# Patient Record
Sex: Male | Born: 1985 | Hispanic: No | Marital: Married | Smoking: Never smoker
Health system: Southern US, Community
[De-identification: ages and names within clinical notes are randomized; demographics above are authoritative.]

## PROBLEM LIST (undated history)

## (undated) DIAGNOSIS — E559 Vitamin D deficiency, unspecified: Secondary | ICD-10-CM

## (undated) DIAGNOSIS — R0681 Apnea, not elsewhere classified: Secondary | ICD-10-CM

## (undated) DIAGNOSIS — G43909 Migraine, unspecified, not intractable, without status migrainosus: Secondary | ICD-10-CM

## (undated) DIAGNOSIS — R079 Chest pain, unspecified: Secondary | ICD-10-CM

## (undated) DIAGNOSIS — N4889 Other specified disorders of penis: Secondary | ICD-10-CM

## (undated) DIAGNOSIS — E785 Hyperlipidemia, unspecified: Secondary | ICD-10-CM

## (undated) HISTORY — DX: Apnea, not elsewhere classified: R06.81

## (undated) HISTORY — DX: Hyperlipidemia, unspecified: E78.5

## (undated) HISTORY — DX: Vitamin D deficiency, unspecified: E55.9

## (undated) HISTORY — PX: NO PAST SURGERIES: SHX2092

## (undated) HISTORY — DX: Chest pain, unspecified: R07.9

## (undated) HISTORY — PX: CARDIAC CATHETERIZATION: SHX172

---

## 2010-07-26 ENCOUNTER — Other Ambulatory Visit: Payer: Self-pay | Admitting: Internal Medicine

## 2010-07-26 ENCOUNTER — Ambulatory Visit
Admission: RE | Admit: 2010-07-26 | Discharge: 2010-07-26 | Disposition: A | Discharge status: Home / Self Care | Payer: No Typology Code available for payment source | Source: Ambulatory Visit | Attending: Internal Medicine | Admitting: Internal Medicine

## 2010-08-21 ENCOUNTER — Other Ambulatory Visit: Payer: Self-pay | Admitting: Neurosurgery

## 2010-08-22 ENCOUNTER — Ambulatory Visit
Admission: RE | Admit: 2010-08-22 | Discharge: 2010-08-22 | Disposition: A | Discharge status: Home / Self Care | Payer: No Typology Code available for payment source | Source: Ambulatory Visit | Attending: Neurosurgery | Admitting: Neurosurgery

## 2010-08-22 MED ORDER — GADOBENATE DIMEGLUMINE 529 MG/ML IV SOLN
10.0000 mL | Freq: Once | INTRAVENOUS | Status: AC | PRN
  Administered 2010-08-22: 10 mL via INTRAVENOUS

## 2010-10-24 ENCOUNTER — Other Ambulatory Visit: Payer: Self-pay | Admitting: Neurosurgery

## 2010-10-24 DIAGNOSIS — G930 Cerebral cysts: Secondary | ICD-10-CM

## 2010-11-27 ENCOUNTER — Ambulatory Visit
Admission: RE | Admit: 2010-11-27 | Discharge: 2010-11-27 | Disposition: A | Discharge status: Home / Self Care | Payer: No Typology Code available for payment source | Source: Ambulatory Visit | Attending: Neurosurgery | Admitting: Neurosurgery

## 2010-11-27 DIAGNOSIS — G930 Cerebral cysts: Secondary | ICD-10-CM

## 2010-11-27 MED ORDER — IOHEXOL 300 MG/ML  SOLN
75.0000 mL | Freq: Once | INTRAMUSCULAR | Status: AC | PRN
  Administered 2010-11-27: 75 mL via INTRAVENOUS

## 2011-11-04 ENCOUNTER — Other Ambulatory Visit: Payer: Self-pay | Admitting: Neurosurgery

## 2011-11-04 DIAGNOSIS — G930 Cerebral cysts: Secondary | ICD-10-CM

## 2011-11-22 ENCOUNTER — Ambulatory Visit
Admission: RE | Admit: 2011-11-22 | Discharge: 2011-11-22 | Disposition: A | Discharge status: Home / Self Care | Payer: No Typology Code available for payment source | Source: Ambulatory Visit | Attending: Neurosurgery | Admitting: Neurosurgery

## 2011-11-22 DIAGNOSIS — G930 Cerebral cysts: Secondary | ICD-10-CM

## 2013-10-04 ENCOUNTER — Other Ambulatory Visit: Payer: Self-pay | Admitting: Neurosurgery

## 2013-10-04 DIAGNOSIS — G930 Cerebral cysts: Secondary | ICD-10-CM

## 2013-10-12 ENCOUNTER — Ambulatory Visit
Admission: RE | Admit: 2013-10-12 | Discharge: 2013-10-12 | Disposition: A | Discharge status: Home / Self Care | Payer: BC Managed Care – PPO | Source: Ambulatory Visit | Attending: Neurosurgery | Admitting: Neurosurgery

## 2013-10-12 DIAGNOSIS — G930 Cerebral cysts: Secondary | ICD-10-CM

## 2014-09-01 ENCOUNTER — Other Ambulatory Visit: Payer: Self-pay | Admitting: Internal Medicine

## 2014-09-01 DIAGNOSIS — R519 Headache, unspecified: Secondary | ICD-10-CM

## 2014-09-01 DIAGNOSIS — R51 Headache: Principal | ICD-10-CM

## 2014-09-06 ENCOUNTER — Other Ambulatory Visit: Payer: No Typology Code available for payment source

## 2014-09-19 ENCOUNTER — Inpatient Hospital Stay: Admission: RE | Admit: 2014-09-19 | Payer: No Typology Code available for payment source | Source: Ambulatory Visit

## 2014-09-21 ENCOUNTER — Other Ambulatory Visit: Payer: No Typology Code available for payment source

## 2014-11-03 ENCOUNTER — Other Ambulatory Visit: Payer: Self-pay | Admitting: Internal Medicine

## 2014-11-03 DIAGNOSIS — G930 Cerebral cysts: Secondary | ICD-10-CM

## 2014-11-03 DIAGNOSIS — R51 Headache: Principal | ICD-10-CM

## 2014-11-03 DIAGNOSIS — R519 Headache, unspecified: Secondary | ICD-10-CM

## 2014-11-07 ENCOUNTER — Ambulatory Visit
Admission: RE | Admit: 2014-11-07 | Discharge: 2014-11-07 | Disposition: A | Discharge status: Home / Self Care | Payer: 59 | Source: Ambulatory Visit | Attending: Internal Medicine | Admitting: Internal Medicine

## 2014-11-07 DIAGNOSIS — G930 Cerebral cysts: Secondary | ICD-10-CM

## 2014-11-07 DIAGNOSIS — R51 Headache: Principal | ICD-10-CM

## 2014-11-07 DIAGNOSIS — R519 Headache, unspecified: Secondary | ICD-10-CM

## 2014-11-07 MED ORDER — IOPAMIDOL (ISOVUE-300) INJECTION 61%
75.0000 mL | Freq: Once | INTRAVENOUS | Status: AC | PRN
  Administered 2014-11-07: 75 mL via INTRAVENOUS

## 2015-02-28 ENCOUNTER — Emergency Department (HOSPITAL_BASED_OUTPATIENT_CLINIC_OR_DEPARTMENT_OTHER)
Admission: EM | Admit: 2015-02-28 | Discharge: 2015-02-28 | Disposition: A | Discharge status: Home / Self Care | Payer: 59 | Attending: Emergency Medicine | Admitting: Emergency Medicine

## 2015-02-28 ENCOUNTER — Encounter (HOSPITAL_BASED_OUTPATIENT_CLINIC_OR_DEPARTMENT_OTHER): Payer: Self-pay

## 2015-02-28 ENCOUNTER — Emergency Department (HOSPITAL_BASED_OUTPATIENT_CLINIC_OR_DEPARTMENT_OTHER): Payer: 59

## 2015-02-28 DIAGNOSIS — S20219A Contusion of unspecified front wall of thorax, initial encounter: Secondary | ICD-10-CM | POA: Insufficient documentation

## 2015-02-28 DIAGNOSIS — S79911A Unspecified injury of right hip, initial encounter: Secondary | ICD-10-CM | POA: Insufficient documentation

## 2015-02-28 DIAGNOSIS — M545 Low back pain, unspecified: Secondary | ICD-10-CM

## 2015-02-28 DIAGNOSIS — T148XXA Other injury of unspecified body region, initial encounter: Secondary | ICD-10-CM

## 2015-02-28 DIAGNOSIS — Y9389 Activity, other specified: Secondary | ICD-10-CM | POA: Insufficient documentation

## 2015-02-28 DIAGNOSIS — Y92410 Unspecified street and highway as the place of occurrence of the external cause: Secondary | ICD-10-CM | POA: Insufficient documentation

## 2015-02-28 DIAGNOSIS — S4991XA Unspecified injury of right shoulder and upper arm, initial encounter: Secondary | ICD-10-CM | POA: Insufficient documentation

## 2015-02-28 DIAGNOSIS — S3992XA Unspecified injury of lower back, initial encounter: Secondary | ICD-10-CM | POA: Insufficient documentation

## 2015-02-28 DIAGNOSIS — Y998 Other external cause status: Secondary | ICD-10-CM | POA: Insufficient documentation

## 2015-02-28 MED ORDER — HYDROMORPHONE HCL 1 MG/ML IJ SOLN
1.0000 mg | Freq: Once | INTRAMUSCULAR | Status: AC
  Administered 2015-02-28: 1 mg via INTRAMUSCULAR
  Filled 2015-02-28: qty 1

## 2015-02-28 MED ORDER — HYDROCODONE-ACETAMINOPHEN 5-325 MG PO TABS: 1.0000 | ORAL_TABLET | Freq: Four times a day (QID) | ORAL | Status: DC | PRN

## 2015-02-28 MED ORDER — METHOCARBAMOL 500 MG PO TABS
500.0000 mg | ORAL_TABLET | Freq: Once | ORAL | Status: AC
  Administered 2015-02-28: 500 mg via ORAL
  Filled 2015-02-28: qty 1

## 2015-02-28 MED ORDER — METHOCARBAMOL 500 MG PO TABS: 500.0000 mg | ORAL_TABLET | Freq: Two times a day (BID) | ORAL | Status: DC

## 2015-02-28 MED ORDER — HYDROCODONE-ACETAMINOPHEN 5-325 MG PO TABS
1.0000 | ORAL_TABLET | Freq: Once | ORAL | Status: AC
  Administered 2015-02-28: 1 via ORAL
  Filled 2015-02-28: qty 1

## 2015-02-28 MED ORDER — IBUPROFEN 400 MG PO TABS
600.0000 mg | ORAL_TABLET | Freq: Once | ORAL | Status: AC
  Administered 2015-02-28: 600 mg via ORAL
  Filled 2015-02-28 (×2): qty 1

## 2015-02-28 MED ORDER — IBUPROFEN 600 MG PO TABS: 600.0000 mg | ORAL_TABLET | Freq: Four times a day (QID) | ORAL | Status: DC | PRN

## 2015-02-28 NOTE — ED Notes (Signed)
MD at bedside. 
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
## 2015-02-28 NOTE — ED Notes (Signed)
Patient transported to CT 
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
## 2015-07-07 ENCOUNTER — Other Ambulatory Visit: Payer: Self-pay | Admitting: Otolaryngology

## 2015-07-07 ENCOUNTER — Ambulatory Visit
Admission: RE | Admit: 2015-07-07 | Discharge: 2015-07-07 | Disposition: A | Discharge status: Home / Self Care | Payer: BLUE CROSS/BLUE SHIELD | Source: Ambulatory Visit | Attending: Otolaryngology | Admitting: Otolaryngology

## 2015-07-07 DIAGNOSIS — J3489 Other specified disorders of nose and nasal sinuses: Secondary | ICD-10-CM

## 2015-07-07 DIAGNOSIS — R6884 Jaw pain: Secondary | ICD-10-CM

## 2015-07-07 DIAGNOSIS — R519 Headache, unspecified: Secondary | ICD-10-CM

## 2015-07-07 DIAGNOSIS — R51 Headache: Secondary | ICD-10-CM

## 2015-07-07 DIAGNOSIS — M542 Cervicalgia: Secondary | ICD-10-CM

## 2015-07-07 DIAGNOSIS — G8929 Other chronic pain: Secondary | ICD-10-CM

## 2015-12-05 ENCOUNTER — Ambulatory Visit: Payer: BLUE CROSS/BLUE SHIELD | Admitting: Neurology

## 2016-01-31 ENCOUNTER — Ambulatory Visit: Payer: BLUE CROSS/BLUE SHIELD | Admitting: Neurology

## 2016-02-09 ENCOUNTER — Ambulatory Visit: Payer: Self-pay

## 2016-05-23 ENCOUNTER — Other Ambulatory Visit: Payer: Self-pay | Admitting: Urology

## 2016-05-28 ENCOUNTER — Encounter (HOSPITAL_BASED_OUTPATIENT_CLINIC_OR_DEPARTMENT_OTHER): Payer: Self-pay | Admitting: *Deleted

## 2016-05-28 NOTE — Progress Notes (Signed)
NPO AFTER MN.  ARRIVE AT 0715.  NEEDS HG. 

## 2016-06-03 ENCOUNTER — Encounter (HOSPITAL_BASED_OUTPATIENT_CLINIC_OR_DEPARTMENT_OTHER): Payer: Self-pay | Admitting: Anesthesiology

## 2016-06-03 ENCOUNTER — Ambulatory Visit (HOSPITAL_BASED_OUTPATIENT_CLINIC_OR_DEPARTMENT_OTHER)
Admission: RE | Admit: 2016-06-03 | Discharge: 2016-06-03 | Disposition: A | Discharge status: Home / Self Care | Payer: BLUE CROSS/BLUE SHIELD | Source: Ambulatory Visit | Attending: Urology | Admitting: Urology

## 2016-06-03 ENCOUNTER — Ambulatory Visit (HOSPITAL_BASED_OUTPATIENT_CLINIC_OR_DEPARTMENT_OTHER): Payer: BLUE CROSS/BLUE SHIELD | Admitting: Anesthesiology

## 2016-06-03 ENCOUNTER — Encounter (HOSPITAL_BASED_OUTPATIENT_CLINIC_OR_DEPARTMENT_OTHER)
Admission: RE | Disposition: A | Discharge status: Home / Self Care | Payer: Self-pay | Source: Ambulatory Visit | Attending: Urology

## 2016-06-03 DIAGNOSIS — N486 Induration penis plastica: Secondary | ICD-10-CM | POA: Insufficient documentation

## 2016-06-03 DIAGNOSIS — N4889 Other specified disorders of penis: Secondary | ICD-10-CM

## 2016-06-03 HISTORY — DX: Other specified disorders of penis: N48.89

## 2016-06-03 HISTORY — PX: NESBIT PROCEDURE: SHX2087

## 2016-06-03 HISTORY — DX: Migraine, unspecified, not intractable, without status migrainosus: G43.909

## 2016-06-03 LAB — POCT HEMOGLOBIN-HEMACUE: Hemoglobin: 15.6 g/dL (ref 13.0–17.0)

## 2016-06-03 SURGERY — NESBIT PROCEDURE
Anesthesia: General

## 2016-06-03 MED ORDER — MEPERIDINE HCL 25 MG/ML IJ SOLN
6.2500 mg | INTRAMUSCULAR | Status: DC | PRN
  Filled 2016-06-03: qty 1

## 2016-06-03 MED ORDER — PAPAVERINE HCL 30 MG/ML IJ SOLN
INTRAMUSCULAR | Status: DC | PRN
  Administered 2016-06-03: 120 mg via INTRAVENOUS

## 2016-06-03 MED ORDER — LACTATED RINGERS IV SOLN
INTRAVENOUS | Status: DC
  Administered 2016-06-03 (×2): via INTRAVENOUS
  Filled 2016-06-03: qty 1000

## 2016-06-03 MED ORDER — OXYCODONE HCL 5 MG/5ML PO SOLN
5.0000 mg | Freq: Once | ORAL | Status: AC | PRN
  Filled 2016-06-03: qty 5

## 2016-06-03 MED ORDER — PROMETHAZINE HCL 25 MG/ML IJ SOLN
6.2500 mg | INTRAMUSCULAR | Status: DC | PRN
  Filled 2016-06-03: qty 1

## 2016-06-03 MED ORDER — PHENYLEPHRINE HCL 10 MG/ML IJ SOLN
INTRAMUSCULAR | Status: AC
  Filled 2016-06-03: qty 1

## 2016-06-03 MED ORDER — SODIUM CHLORIDE 0.9 % IJ SOLN
INTRAMUSCULAR | Status: AC
  Filled 2016-06-03: qty 50

## 2016-06-03 MED ORDER — CEFAZOLIN SODIUM-DEXTROSE 2-4 GM/100ML-% IV SOLN
2.0000 g | INTRAVENOUS | Status: AC
  Administered 2016-06-03: 2 g via INTRAVENOUS
  Filled 2016-06-03: qty 100

## 2016-06-03 MED ORDER — PROPOFOL 10 MG/ML IV BOLUS
INTRAVENOUS | Status: AC
  Filled 2016-06-03: qty 20

## 2016-06-03 MED ORDER — FENTANYL CITRATE (PF) 100 MCG/2ML IJ SOLN
INTRAMUSCULAR | Status: AC
  Filled 2016-06-03: qty 2

## 2016-06-03 MED ORDER — OXYCODONE HCL 5 MG PO TABS
ORAL_TABLET | ORAL | Status: AC
  Filled 2016-06-03: qty 1

## 2016-06-03 MED ORDER — PROPOFOL 10 MG/ML IV BOLUS
INTRAVENOUS | Status: DC | PRN
  Administered 2016-06-03: 50 mg via INTRAVENOUS
  Administered 2016-06-03: 200 mg via INTRAVENOUS
  Administered 2016-06-03: 50 mg via INTRAVENOUS

## 2016-06-03 MED ORDER — CEFAZOLIN SODIUM-DEXTROSE 2-4 GM/100ML-% IV SOLN
INTRAVENOUS | Status: AC
  Filled 2016-06-03: qty 100

## 2016-06-03 MED ORDER — HYDROMORPHONE HCL 1 MG/ML IJ SOLN
0.2500 mg | INTRAMUSCULAR | Status: DC | PRN
  Filled 2016-06-03: qty 0.5

## 2016-06-03 MED ORDER — LIDOCAINE 2% (20 MG/ML) 5 ML SYRINGE
INTRAMUSCULAR | Status: DC | PRN
  Administered 2016-06-03: 80 mg via INTRAVENOUS

## 2016-06-03 MED ORDER — LIDOCAINE 2% (20 MG/ML) 5 ML SYRINGE
INTRAMUSCULAR | Status: AC
  Filled 2016-06-03: qty 10

## 2016-06-03 MED ORDER — PHENYLEPHRINE HCL 10 MG/ML IJ SOLN
INTRAMUSCULAR | Status: DC | PRN
  Administered 2016-06-03: 0.1 mg

## 2016-06-03 MED ORDER — FENTANYL CITRATE (PF) 100 MCG/2ML IJ SOLN
INTRAMUSCULAR | Status: DC | PRN
Start: 2016-06-03 — End: 2016-06-03
  Administered 2016-06-03 (×3): 50 ug via INTRAVENOUS

## 2016-06-03 MED ORDER — DEXAMETHASONE SODIUM PHOSPHATE 10 MG/ML IJ SOLN
INTRAMUSCULAR | Status: AC
  Filled 2016-06-03: qty 1

## 2016-06-03 MED ORDER — MIDAZOLAM HCL 5 MG/5ML IJ SOLN
INTRAMUSCULAR | Status: DC | PRN
  Administered 2016-06-03: 2 mg via INTRAVENOUS

## 2016-06-03 MED ORDER — OXYCODONE HCL 10 MG PO TABS: 10.0000 mg | ORAL_TABLET | ORAL | 0 refills | Status: DC | PRN

## 2016-06-03 MED ORDER — MIDAZOLAM HCL 2 MG/2ML IJ SOLN
INTRAMUSCULAR | Status: AC
  Filled 2016-06-03: qty 2

## 2016-06-03 MED ORDER — ONDANSETRON HCL 4 MG/2ML IJ SOLN
INTRAMUSCULAR | Status: DC | PRN
  Administered 2016-06-03: 4 mg via INTRAVENOUS

## 2016-06-03 MED ORDER — ONDANSETRON HCL 4 MG/2ML IJ SOLN
INTRAMUSCULAR | Status: AC
  Filled 2016-06-03: qty 2

## 2016-06-03 MED ORDER — OXYCODONE HCL 5 MG PO TABS
5.0000 mg | ORAL_TABLET | Freq: Once | ORAL | Status: AC | PRN
  Administered 2016-06-03: 5 mg via ORAL
  Filled 2016-06-03: qty 1

## 2016-06-03 MED ORDER — DEXAMETHASONE SODIUM PHOSPHATE 4 MG/ML IJ SOLN
INTRAMUSCULAR | Status: DC | PRN
  Administered 2016-06-03: 10 mg via INTRAVENOUS

## 2016-06-03 MED FILL — oxyCODONE HCL 10 MG TABS: 10 | 5 days supply | Qty: 28 | Fill #0

## 2016-06-03 SURGICAL SUPPLY — 53 items
BANDAGE CO FLEX L/F 2IN X 5YD (GAUZE/BANDAGES/DRESSINGS) ×2 IMPLANT
BLADE CLIPPER SURG (BLADE) ×2 IMPLANT
BLADE SURG 15 STRL LF DISP TIS (BLADE) ×1 IMPLANT
BLADE SURG 15 STRL SS (BLADE) ×1
BNDG CONFORM 2 STRL LF (GAUZE/BANDAGES/DRESSINGS) ×2 IMPLANT
BNDG GAUZE ELAST 4 BULKY (GAUZE/BANDAGES/DRESSINGS) ×2 IMPLANT
CATH FOLEY 2WAY SLVR  5CC 16FR (CATHETERS)
CATH FOLEY 2WAY SLVR 5CC 16FR (CATHETERS) IMPLANT
CATH ROBINSON RED A/P 8FR (CATHETERS) ×2 IMPLANT
COVER BACK TABLE 60X90IN (DRAPES) ×2 IMPLANT
COVER MAYO STAND STRL (DRAPES) ×2 IMPLANT
DRAIN PENROSE 18X1/4 LTX STRL (WOUND CARE) IMPLANT
DRAPE LAPAROTOMY 100X72 PEDS (DRAPES) ×2 IMPLANT
ELECT NEEDLE TIP 2.8 STRL (NEEDLE) ×2 IMPLANT
ELECT REM PT RETURN 9FT ADLT (ELECTROSURGICAL) ×2
ELECTRODE REM PT RTRN 9FT ADLT (ELECTROSURGICAL) ×1 IMPLANT
GAUZE SPONGE 4X4 12PLY STRL (GAUZE/BANDAGES/DRESSINGS) ×2 IMPLANT
GLOVE BIO SURGEON STRL SZ8 (GLOVE) ×2 IMPLANT
GOWN STRL REUS W/ TWL LRG LVL3 (GOWN DISPOSABLE) ×2 IMPLANT
GOWN STRL REUS W/ TWL XL LVL3 (GOWN DISPOSABLE) ×1 IMPLANT
GOWN STRL REUS W/TWL LRG LVL3 (GOWN DISPOSABLE) ×2
GOWN STRL REUS W/TWL XL LVL3 (GOWN DISPOSABLE) ×1
KIT ROOM TURNOVER WOR (KITS) ×2 IMPLANT
LOOP VESSEL MAXI BLUE (MISCELLANEOUS) IMPLANT
NEEDLE HYPO 25X1 1.5 SAFETY (NEEDLE) ×2 IMPLANT
NEEDLE HYPO 30GX1 BEV (NEEDLE) ×2 IMPLANT
NEEDLE HYPO 30X.5 LL (NEEDLE) ×4 IMPLANT
NS IRRIG 500ML POUR BTL (IV SOLUTION) ×2 IMPLANT
PACK BASIN DAY SURGERY FS (CUSTOM PROCEDURE TRAY) ×2 IMPLANT
PENCIL BUTTON HOLSTER BLD 10FT (ELECTRODE) ×2 IMPLANT
PLUG CATH AND CAP STER (CATHETERS) ×2 IMPLANT
SET IRRIG Y TYPE TUR BLADDER L (SET/KITS/TRAYS/PACK) IMPLANT
SPONGE LAP 4X18 X RAY DECT (DISPOSABLE) ×2 IMPLANT
SUCTION FRAZIER HANDLE 10FR (MISCELLANEOUS)
SUCTION TUBE FRAZIER 10FR DISP (MISCELLANEOUS) IMPLANT
SUPPORT SCROTAL MED ADLT STRP (MISCELLANEOUS) ×2 IMPLANT
SUT CHROMIC 4 0 RB 1X27 (SUTURE) ×4 IMPLANT
SUT CHROMIC 5 0 RB 1 27 (SUTURE) IMPLANT
SUT ETHIBOND 2 0 SH (SUTURE) ×3
SUT ETHIBOND 2 0 SH 36X2 (SUTURE) ×3 IMPLANT
SUT ETHIBOND 3-0 V-5 (SUTURE) IMPLANT
SUT SILK 4 0 TIES 17X18 (SUTURE) ×2 IMPLANT
SYR 10ML LL (SYRINGE) ×2 IMPLANT
SYR 3ML 23GX1 SAFETY (SYRINGE) ×4 IMPLANT
SYR BULB IRRIGATION 50ML (SYRINGE) ×2 IMPLANT
SYR CONTROL 10ML LL (SYRINGE) ×2 IMPLANT
SYR TB 1ML 27GX1/2 SAFE (SYRINGE) ×1 IMPLANT
SYR TB 1ML 27GX1/2 SAFETY (SYRINGE) ×1
SYRINGE 10CC LL (SYRINGE) ×2 IMPLANT
TOWEL OR 17X24 6PK STRL BLUE (TOWEL DISPOSABLE) ×4 IMPLANT
TUBE CONNECTING 12X1/4 (SUCTIONS) ×2 IMPLANT
WATER STERILE IRR 500ML POUR (IV SOLUTION) ×2 IMPLANT
YANKAUER SUCT BULB TIP NO VENT (SUCTIONS) ×2 IMPLANT

## 2016-06-03 NOTE — Anesthesia Postprocedure Evaluation (Signed)
Anesthesia Post Note  Patient: Dustin Bell  Procedure(s) Performed: Procedure(s) (LRB): NESBIT PROCEDURE 16 DOT PLICATION (N/A)  Patient location during evaluation: PACU Anesthesia Type: General Level of consciousness: awake and alert Pain management: pain level controlled Vital Signs Assessment: post-procedure vital signs reviewed and stable Respiratory status: spontaneous breathing, nonlabored ventilation, respiratory function stable and patient connected to nasal cannula oxygen Cardiovascular status: blood pressure returned to baseline and stable Postop Assessment: no signs of nausea or vomiting Anesthetic complications: no       Last Vitals:  Vitals:   06/03/16 1100 06/03/16 1315  BP: 112/62 127/77  Pulse: 90 73  Resp: 15 16  Temp:  36.3 C    Last Pain:  Vitals:   06/03/16 1300  TempSrc:   PainSc: 3                  Shelton SilvasKevin D Kloe Oates

## 2016-06-03 NOTE — Transfer of Care (Signed)
Immediate Anesthesia Transfer of Care Note  Patient: Dustin Bell  Procedure(s) Performed: Procedure(s): NESBIT PROCEDURE 16 DOT PLICATION (N/A)  Patient Location: PACU  Anesthesia Type:General  Level of Consciousness: awake, alert , oriented and patient cooperative  Airway & Oxygen Therapy: Patient Spontanous Breathing and Patient connected to nasal cannula oxygen  Post-op Assessment: Report given to RN and Post -op Vital signs reviewed and stable  Post vital signs: Reviewed and stable  Last Vitals:  Vitals:   06/03/16 0737  BP: 129/75  Pulse: 82  Resp: 16  Temp: 36.5 C    Last Pain:  Vitals:   06/03/16 0737  TempSrc: Oral      Patients Stated Pain Goal: 7 (06/03/16 0824)  Complications: No apparent anesthesia complications

## 2016-06-03 NOTE — Anesthesia Preprocedure Evaluation (Signed)
Anesthesia Evaluation  Patient identified by MRN, date of birth, ID band Patient awake    Reviewed: Allergy & Precautions, NPO status , Patient's Chart, lab work & pertinent test results  Airway Mallampati: II  TM Distance: >3 FB Neck ROM: Full    Dental no notable dental hx.    Pulmonary neg pulmonary ROS,    Pulmonary exam normal breath sounds clear to auscultation       Cardiovascular negative cardio ROS Normal cardiovascular exam Rhythm:Regular Rate:Normal     Neuro/Psych negative neurological ROS  negative psych ROS   GI/Hepatic negative GI ROS, Neg liver ROS,   Endo/Other  negative endocrine ROS  Renal/GU negative Renal ROS     Musculoskeletal negative musculoskeletal ROS (+)   Abdominal   Peds  Hematology negative hematology ROS (+)   Anesthesia Other Findings   Reproductive/Obstetrics                             Anesthesia Physical Anesthesia Plan  ASA: I  Anesthesia Plan: General   Post-op Pain Management:    Induction: Intravenous  Airway Management Planned: LMA  Additional Equipment:   Intra-op Plan:   Post-operative Plan: Extubation in OR  Informed Consent: I have reviewed the patients History and Physical, chart, labs and discussed the procedure including the risks, benefits and alternatives for the proposed anesthesia with the patient or authorized representative who has indicated his/her understanding and acceptance.   Dental advisory given  Plan Discussed with: CRNA  Anesthesia Plan Comments:         Anesthesia Quick Evaluation  

## 2016-06-03 NOTE — H&P (Signed)
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 He has had penile curvature with his erections for 3 years. His penis curves upward. His penile curvature is based in the penile base. His curvature has not changed in the past 6 months. His curvature has not changed in the past 12 months.   He has not noticed a knot on his penis. He does not have penile narrowing with his erections. He does not have penile shortening with his erections. His symptoms of Peyronie's Disease do have an impact on intercourse. He has not been previously treated.   He does complain of curved erections, dorsally. This is been that way for 2-3 years. He would like to consider therapy to straighten this.   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 He said he slowly developed dorsal curvature from the base of the penis. He is able to achieve a good, firm erection. He has no pain with his erections. It has stabilized.     ALLERGIES: No Allergies    MEDICATIONS: Flomax 0.4 mg capsule, ext release 24 hr 1 capsule PO Daily  FLUoxetine HCl - 20 MG Oral Tablet Oral     GU PSH: Cystoscopy - 05/06/2016    NON-GU PSH: None   GU PMH: Organic oligospermia (Stable), He has seen Dr. 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 It was recommended that he have IVF - 05/06/2016, Oligospermia, - 07/11/2015 Peyronies Disease (Stable), This is bothersome and he would like an attempted repair - 05/06/2016, Peyronie's disease, - 07/11/2015 Splitting of Stream (Stable), He does complain of a slow stream. Cystoscopically there is no stricture. - 05/06/2016, (Stable, Chronic), Needs to RTC for cysto. , - 04/24/2016 Male Infertility, Nance Pear, Male infertility - 07/11/2015      PMH Notes:  2015-04-05 13:43:54 - Note: No significant past  medical history   NON-GU PMH: Encounter for general adult medical examination without abnormal findings, Encounter for preventive health examination - 04/05/2015    FAMILY HISTORY: No pertinent family history - Runs In Family   SOCIAL HISTORY: Marital Status: Married Current Smoking Status: Patient has never smoked.  Has never drank.  Does not drink caffeine. 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    REVIEW OF SYSTEMS:    GU Review Male:   Patient reports burning/ pain with urination and penile pain. Patient denies frequent urination, hard to postpone urination, get up at night to urinate, leakage of urine, stream starts and stops, trouble starting your stream, have to strain to urinate , and erection problems.  Gastrointestinal (Upper):   Patient denies vomiting, nausea, and indigestion/ heartburn.  Gastrointestinal (Lower):   Patient denies diarrhea and constipation.  Constitutional:   Patient denies fever, night sweats, weight loss, and fatigue.  Skin:   Patient denies skin rash/ lesion and itching.  Eyes:   Patient denies blurred vision and double vision.  Ears/ Nose/ Throat:   Patient denies sore throat and sinus problems.  Hematologic/Lymphatic:   Patient denies swollen glands and easy bruising.  Cardiovascular:   Patient denies leg swelling and chest pains.  Respiratory:   Patient denies cough and shortness of breath.  Endocrine:   Patient denies excessive thirst.  Musculoskeletal:   Patient denies back pain and joint pain.  Neurological:  Patient denies headaches and dizziness.  Psychologic:   Patient denies depression and anxiety.   VITAL SIGNS:    Weight 148 lb / 67.13 kg  Height 68 in / 172.72 cm  BP 118/77 mmHg  Pulse 80 /min  BMI 22.5 kg/m   GU PHYSICAL EXAMINATION:    Penis: Circumcised, no foreskin warts, no cracks. I was able to palpate some slight fullness dorsally at the base of his penis consistent with a mild Peyronie's plaque.. No  balanitis, no meatal stenosis.    MULTI-SYSTEM PHYSICAL EXAMINATION:    Constitutional: Well-nourished. No physical deformities. Normally developed. Good grooming.  Neck: Neck symmetrical, not swollen. Normal tracheal position.  Respiratory: No labored breathing, no use of accessory muscles.   Cardiovascular: Normal temperature, normal extremity pulses, no swelling, no varicosities.  Lymphatic: No enlargement of neck, axillae, groin.  Skin: No paleness, no jaundice, no cyanosis. No lesion, no ulcer, no rash.  Neurologic / Psychiatric: Oriented to time, oriented to place, oriented to person. No depression, no anxiety, no agitation.  Gastrointestinal: No mass, no tenderness, no rigidity, non obese abdomen.  Eyes: Normal conjunctivae. Normal eyelids.  Ears, Nose, Mouth, and Throat: Left ear no scars, no lesions, no masses. Right ear no scars, no lesions, no masses. Nose no scars, no lesions, no masses. Normal hearing. Normal lips.  Musculoskeletal: Normal gait and station of head and neck.      PAST DATA REVIEWED:  Source Of History:  Patient  Records Review:   Previous Patient Records, POC Tool   PROCEDURES:          Urinalysis Dipstick Dipstick Cont'd  Color: Yellow Bilirubin: Neg  Appearance: Clear Ketones: Neg  Specific Gravity: 1.015 Blood: Neg  pH: 5.5 Protein: Neg  Glucose: Neg Urobilinogen: 0.2    Nitrites: Neg    Leukocyte Esterase: Neg    ASSESSMENT:      ICD-10 Details  1 GU:   Peyronies Disease  We discussed the treatment options available for his penile curvature. Because he is able to achieve a satisfactory erection I did not discuss penile prosthesis implantation to any great degree with him however I did discuss plication as a surgical means of correcting his curvature with the understanding that this could potentially result in some foreshortening of the penis. I made him aware of the fact that the procedure would be shortening the penis on the contralateral side to  the curvature and the curvature was caused by foreshortening of the penis on the side where he was experiencing the curvature. We did discuss plaque incision and patch grafting as an alternative which may result in less penile shortening but does carry greater risk of de novo erectile dysfunction and glans numbness.   He was interested in [place]. We discussed the fact that the medication is a collagenase and how this affects the Peyronie's plaque. I discussed with the patient the treatment course which consists of a maximum of 4 treatments cycles. Each treatment cycle consists of 2 Xiaflex injection procedures and one in office penile modeling procedure. The second Xiaflex injection procedure occurs 1-3 days after the first. The in office modeling procedure is performed 1-3 days after the second injection. After the third visit of each treatment cycle, the patient performs approximately 6 weeks of daily, at home penile modeling activities. We discussed the fact that up to 4 treatment cycles can be performed with the goal of achieving less than 15 of curvature.   We discussed the risks including the risk  of penile fracture, bruising and/or swelling and mild to moderate penile pain. The patient was counseled to contact the office immediately at any time if he should experience a popping sound or sensation in an erect penis, the sudden loss of the ability to maintain an erection, severe purple bruising and swelling of the penis, difficulty urinating or blood in the urine or severe pain in the penis. He was also counseled to wait 2 weeks after the second injection of each treatment cycle before resuming sexual activity, provided pain and swelling have subsided.   I then discussed with the patient that prior to each treatment cycle an artificial erection will be induced by injecting intracavernosal PGE-1 and that the plaque will be located by palpation and point of maximum concavity. This area will be marked and  the penis allowed to detumesce. An injection of a second medication (Neo-Synephrine) may be necessary in order to result in detumescence.   I went over the probability of success being defined as a decrease in his degree of curvature of to approximately 15% or less however a decrease in curvature to the degree that he is able to satisfactorily engage in intercourse and that he and his partner are pleased with his ultimately the goal.   After a thorough discussion and having answered all of his questions to his satisfaction.  He has elected to proceed with surgical correction.  I have gone over the incision used, the risks and complications as well as the outpatient nature of the procedure and the anticipated postoperative course.  He understands and has elected to proceed.     PLAN: 16-Dot plication.

## 2016-06-03 NOTE — Anesthesia Procedure Notes (Signed)
Procedure Name: LMA Insertion Date/Time: 06/03/2016 8:48 AM Performed by: Tyrone NineSAUVE, Alaiza Yau F Pre-anesthesia Checklist: Patient identified, Timeout performed, Emergency Drugs available, Suction available and Patient being monitored Patient Re-evaluated:Patient Re-evaluated prior to inductionOxygen Delivery Method: Circle system utilized Preoxygenation: Pre-oxygenation with 100% oxygen Intubation Type: IV induction Ventilation: Mask ventilation without difficulty LMA: LMA inserted LMA Size: 4.0 Number of attempts: 1 Airway Equipment and Method: Bite block Placement Confirmation: positive ETCO2 and breath sounds checked- equal and bilateral Tube secured with: Tape Dental Injury: Teeth and Oropharynx as per pre-operative assessment

## 2016-06-03 NOTE — Discharge Instructions (Signed)
Postoperative instructions for penile surgery ° °Wound: ° °In most cases your incision will have absorbable sutures that run along the course of your incision and will dissolve within the first 10-20 days. Some will fall out even earlier. Expect some redness as the sutures dissolved but this should occur only around the sutures. If there is generalized redness, especially with increasing pain or swelling, let us know. The penis will very likely get "black and blue" as the blood in the tissues spread. Sometimes the whole penis will turn colors. The black and blue is followed by a yellow and brown color. In time, all the discoloration will go away. ° °Diet: ° °You may return to your normal diet within 24 hours following your surgery. You may note some mild nausea and possibly vomiting the first 6-8 hours following surgery. This is usually due to the side effects of anesthesia, and will disappear quite soon. I would suggest clear liquids and a very light meal the first evening following your surgery. ° °Activity: ° °Your physical activity should be restricted the first 48 hours. During that time you should remain relatively inactive, moving about only when necessary. During the first 7-10 days following surgery he should avoid lifting any heavy objects (anything greater than 15 pounds), and avoid strenuous exercise. If you work, ask us specifically about your restrictions, both for work and home. We will write a note to your employer if needed. ° °Ice packs can be placed on and off over the penis for the first 48 hours to help relieve the pain and keep the swelling down. Frozen peas or corn in a ZipLock bag can be frozen, used and re-frozen. Fifteen minutes on and 15 minutes off is a reasonable schedule.  ° °Hygiene: ° °You may shower 48 hours after your surgery. Tub bathing should be restricted until the seventh day. ° °Medication: ° °You will be sent home with some type of pain medication. In many cases you will be  sent home with a narcotic pain pill (Vicodin or Tylox). If the pain is not too bad, you may take either Tylenol (acetaminophen) or Advil (ibuprofen) which contain no narcotic agents, and might be tolerated a little better, with fewer side effects. If the pain medication you are sent home with does not control the pain, you will have to let us know. Some narcotic pain medications cannot be given or refilled by a phone call to a pharmacy. ° °Problems you should report to us: ° °· Fever of 101.0 degrees Fahrenheit or greater. °· Moderate or severe swelling under the skin incision or involving the scrotum. °· Drug reaction such as hives, a rash, nausea or vomiting.  ° ° ° °Post Anesthesia Home Care Instructions ° °Activity: °Get plenty of rest for the remainder of the day. A responsible adult should stay with you for 24 hours following the procedure.  °For the next 24 hours, DO NOT: °-Drive a car °-Operate machinery °-Drink alcoholic beverages °-Take any medication unless instructed by your physician °-Make any legal decisions or sign important papers. ° °Meals: °Start with liquid foods such as gelatin or soup. Progress to regular foods as tolerated. Avoid greasy, spicy, heavy foods. If nausea and/or vomiting occur, drink only clear liquids until the nausea and/or vomiting subsides. Call your physician if vomiting continues. ° °Special Instructions/Symptoms: °Your throat may feel dry or sore from the anesthesia or the breathing tube placed in your throat during surgery. If this causes discomfort, gargle with warm salt water.   discomfort should disappear within 24 hours.  If you had a scopolamine patch placed behind your ear for the management of post- operative nausea and/or vomiting:  1. The medication in the patch is effective for 72 hours, after which it should be removed.  Wrap patch in a tissue and discard in the trash. Wash hands thoroughly with soap and water. 2. You may remove the patch earlier than 72  hours if you experience unpleasant side effects which may include dry mouth, dizziness or visual disturbances. 3. Avoid touching the patch. Wash your hands with soap and water after contact with the patch.

## 2016-06-03 NOTE — Op Note (Signed)
PATIENT:  Dustin Bell  PRE-OPERATIVE DIAGNOSIS: Penile curvature  POST-OPERATIVE DIAGNOSIS: Same  PROCEDURE: 8-Dot plication  SURGEON:  Garnett FarmMark C Prashant Glosser  INDICATION: Dustin Bell is a 31 year old male who sustained an injury to his penis at the base. This resulted in the penis curving cephalad during erection. Because of this urination with an erection in the morning was exceedingly difficult and this also affected his ability to have intercourse. We discussed the treatment options and he has elected to proceed with surgical correction. We have discussed the possibility of some foreshortening of the penis as well as the possibility of him being able to feel the knots in the suture used for the procedure. He understands these risks as well as the other potential risks and complications and has elected to proceed.  ANESTHESIA:  General  EBL:  Minimal  DRAINS: None  LOCAL MEDICATIONS USED:  None  SPECIMEN:  None  Description of procedure: After informed consent the patient was taken to the operating room and placed on the table in a supine position. General anesthesia was then administered. Once fully anesthetized I cleaned the side of the penis with an alcohol swab and injected 60 mg of papaverine into the corpus cavernosum using a 30-gauge needle. The genitalia and lower abdomen were then sterilely prepped and draped in standard fashion. An official timeout was then performed.  I noted that with a full erection there was curvature and the flexion of the penis at the base in a cephalad direction with very mild shaft curvature which I do not believe is of clinical significance nor a result of the injury that he had described. I therefore made an incision in the midline on the ventral surface at the penoscrotal junction. This was carried down to the corpus spongiosum in the midline and then I dissected laterally to expose the corpus cavernosum on the right and left sides. I dissected back to  a location deep along the corpus cavernosum.  2-0 Ethibond suture was then used to place the plicating stitches by first going into and then out of the corpus cavernosum at its most proximal extent and then traveling equidistant to the location of greatest curvature and placing a second in and out stitch at this location. This was performed on both right and left sides approximately 3 mm from the corpus spongiosum. I then placed a surgeon's knot and tied each down and placed a shod hemostat at the level of the knot. This allowed me to make adjustments and completely straighten the penis so that while it was a direct it protruded straight out from the body in a normal, anatomic position. I then tied each of the sutures.  I then injected initially 500 g of Neo-Synephrine into the corpus cavernosum using a 30-gauge needle. I repeated this a total of 4 times with minimal detumescence. I therefore placed a 21-gauge butterfly needle into the corpus cavernosum and withdrew 20 mL of blood and then injected a final 1000 g of Neo-Synephrine. This resulted in complete detumescence which persisted. There was no elevation in blood pressure at any time during the procedure.  I then closed the deep tissue over the corpus cavernosum and corpus spongiosum in the midline with a running 4-0 chromic suture. I then closed the skin with a running 4-0 chromic and applied Neosporin ointment to the incision, covered this with gauze and then gently wrapped the penis with Komform. I then used fluffed Curlex and placed this at the base of the  penis and applied a scrotal support. The patient was awakened and taken to the recovery room in stable and satisfactory condition. He tolerated procedure well with no intraoperative complications.  PLAN OF CARE: Discharge to home after PACU  PATIENT DISPOSITION:  PACU - hemodynamically stable.

## 2016-06-04 ENCOUNTER — Encounter (HOSPITAL_BASED_OUTPATIENT_CLINIC_OR_DEPARTMENT_OTHER): Payer: Self-pay | Admitting: Urology

## 2017-02-02 ENCOUNTER — Emergency Department (HOSPITAL_BASED_OUTPATIENT_CLINIC_OR_DEPARTMENT_OTHER): Payer: BLUE CROSS/BLUE SHIELD

## 2017-02-02 ENCOUNTER — Encounter (HOSPITAL_BASED_OUTPATIENT_CLINIC_OR_DEPARTMENT_OTHER): Payer: Self-pay | Admitting: Emergency Medicine

## 2017-02-02 ENCOUNTER — Emergency Department (HOSPITAL_BASED_OUTPATIENT_CLINIC_OR_DEPARTMENT_OTHER)
Admission: EM | Admit: 2017-02-02 | Discharge: 2017-02-02 | Disposition: A | Discharge status: Home / Self Care | Payer: BLUE CROSS/BLUE SHIELD | Attending: Emergency Medicine | Admitting: Emergency Medicine

## 2017-02-02 DIAGNOSIS — J069 Acute upper respiratory infection, unspecified: Secondary | ICD-10-CM | POA: Diagnosis not present

## 2017-02-02 DIAGNOSIS — Z79899 Other long term (current) drug therapy: Secondary | ICD-10-CM | POA: Insufficient documentation

## 2017-02-02 DIAGNOSIS — Z87891 Personal history of nicotine dependence: Secondary | ICD-10-CM | POA: Diagnosis not present

## 2017-02-02 DIAGNOSIS — R05 Cough: Secondary | ICD-10-CM | POA: Diagnosis not present

## 2017-02-02 DIAGNOSIS — R079 Chest pain, unspecified: Secondary | ICD-10-CM | POA: Diagnosis present

## 2017-02-02 DIAGNOSIS — R0789 Other chest pain: Secondary | ICD-10-CM | POA: Diagnosis not present

## 2017-02-02 LAB — CBC WITH DIFFERENTIAL/PLATELET
Basophils Absolute: 0.1 10*3/uL (ref 0.0–0.1)
Basophils Relative: 1 %
EOS ABS: 0.2 10*3/uL (ref 0.0–0.7)
EOS PCT: 3 %
HCT: 42.3 % (ref 39.0–52.0)
Hemoglobin: 14.9 g/dL (ref 13.0–17.0)
LYMPHS PCT: 38 %
Lymphs Abs: 3 10*3/uL (ref 0.7–4.0)
MCH: 28 pg (ref 26.0–34.0)
MCHC: 35.2 g/dL (ref 30.0–36.0)
MCV: 79.5 fL (ref 78.0–100.0)
Monocytes Absolute: 0.5 10*3/uL (ref 0.1–1.0)
Monocytes Relative: 7 %
Neutro Abs: 4.1 10*3/uL (ref 1.7–7.7)
Neutrophils Relative %: 51 %
PLATELETS: 289 10*3/uL (ref 150–400)
RBC: 5.32 MIL/uL (ref 4.22–5.81)
RDW: 12.5 % (ref 11.5–15.5)
WBC: 7.8 10*3/uL (ref 4.0–10.5)

## 2017-02-02 LAB — COMPREHENSIVE METABOLIC PANEL
ALT: 53 U/L (ref 17–63)
ANION GAP: 6 (ref 5–15)
AST: 33 U/L (ref 15–41)
Albumin: 4.6 g/dL (ref 3.5–5.0)
Alkaline Phosphatase: 64 U/L (ref 38–126)
BUN: 12 mg/dL (ref 6–20)
CHLORIDE: 103 mmol/L (ref 101–111)
CO2: 29 mmol/L (ref 22–32)
CREATININE: 0.95 mg/dL (ref 0.61–1.24)
Calcium: 9.2 mg/dL (ref 8.9–10.3)
Glucose, Bld: 87 mg/dL (ref 65–99)
POTASSIUM: 3.6 mmol/L (ref 3.5–5.1)
Sodium: 138 mmol/L (ref 135–145)
Total Bilirubin: 0.4 mg/dL (ref 0.3–1.2)
Total Protein: 7.9 g/dL (ref 6.5–8.1)

## 2017-02-02 LAB — TROPONIN I: Troponin I: <0.03 ng/mL (ref <0.03)

## 2017-02-02 LAB — RAPID STREP SCREEN (MED CTR MEBANE ONLY): STREPTOCOCCUS, GROUP A SCREEN (DIRECT): NEGATIVE

## 2017-02-02 MED ORDER — IBUPROFEN 800 MG PO TABS
800.0000 mg | ORAL_TABLET | Freq: Once | ORAL | Status: AC
  Administered 2017-02-02: 800 mg via ORAL
  Filled 2017-02-02: qty 1

## 2017-02-02 MED ORDER — NAPROXEN 500 MG PO TABS: 500.0000 mg | ORAL_TABLET | Freq: Two times a day (BID) | ORAL | 0 refills | Status: DC

## 2017-02-02 NOTE — ED Provider Notes (Signed)
[facility] Note   CSN: [id number] Arrival date & time: 02/02/17  0027     History   Chief Complaint Chief Complaint  Patient presents with  . Chest Pain    HPI Dustin Bell is a 31 y.o. male.  HPI  This a 31 year old male who presents with cough, chest pain, sore throat. Onset of symptoms earlier today. Patient reports he woke this morning with a sore throat. He reports chills without fevers. He has had a nonproductive cough. Proximal and one hour prior to arrival he developed left-sided sharp chest pain that is nonradiating. Current pain is 8 out of 10. He has not taken anything for the pain. No recent long travel, hospitalization, history of blood clots. He does report a history of hyperlipidemia. No early family history of heart disease.  Past Medical History:  Diagnosis Date  . Migraine   . Penile curvature, acquired    INJURY    There are no active problems to display for this patient.   Past Surgical History:  Procedure Laterality Date  . NESBIT PROCEDURE N/A 06/03/2016   Procedure: NESBIT PROCEDURE 16 DOT PLICATION;  Surgeon: Ihor Gully, MD;  Location: [facility];  Service: Urology;  Laterality: N/A;  . NO PAST SURGERIES         Home Medications    Prior to Admission medications   Medication Sig Start Date End Date Taking? Authorizing Provider  FLUoxetine (PROZAC) 40 MG capsule Take 40 mg by mouth every evening.    [provider]  ibuprofen (ADVIL,MOTRIN) 200 MG tablet Take 200 mg by mouth every 6 (six) hours as needed.    [provider]  naproxen (NAPROSYN) 500 MG tablet Take 1 tablet (500 mg total) by mouth 2 (two) times daily. 02/02/17   Dustin Bell, Dustin Masker, MD  Oxycodone HCl 10 MG TABS Take 1 tablet (10 mg total) by mouth every 4 (four) hours as needed. 06/03/16   Ihor Gully, MD    Family History No family history on file.  Social History Social History  Substance Use Topics  . Smoking  status: Never Smoker  . Smokeless tobacco: Former Neurosurgeon    Types: Snuff    Quit date: 12/27/2015  . Alcohol use No     Allergies   Patient has no known allergies.   Review of Systems Review of Systems  Constitutional: Positive for chills. Negative for fever.  HENT: Positive for sore throat. Negative for trouble swallowing.   Respiratory: Positive for cough. Negative for shortness of breath.   Cardiovascular: Positive for chest pain. Negative for leg swelling.  Gastrointestinal: Negative for abdominal pain, nausea and vomiting.  All other systems reviewed and are negative.    Physical Exam Updated Vital Signs BP 123/85 (BP Location: Right Arm)   Pulse 76   Temp 97.9 F (36.6 C) (Oral)   Resp 17   Ht  (1.727 m)   Wt 70.3 kg (155 lb)   SpO2 99%   BMI 23.57 kg/m   Physical Exam  Constitutional: He is oriented to person, place, and time. He appears well-developed and well-nourished. No distress.  HENT:  Head: Normocephalic and atraumatic.  Uvula midline, posterior oropharynx with mild erythema, scattered palatal petechiae noted, no tonsillar exudate, no tonsillar enlargement, no trismus  Eyes: Pupils are equal, round, and reactive to light.  Neck: Neck supple.  Cardiovascular: Normal rate, regular rhythm and normal heart sounds.   No murmur heard. Pulmonary/Chest: Effort normal and breath  sounds normal. No respiratory distress. He has no wheezes. He exhibits tenderness.  Tenderness palpation left chest wall without crepitus  Abdominal: Soft. There is no tenderness.  Musculoskeletal: He exhibits no edema.  Lymphadenopathy:    He has no cervical adenopathy.  Neurological: He is alert and oriented to person, place, and time.  Skin: Skin is warm and dry.  Psychiatric: He has a normal mood and affect.  Nursing note and vitals reviewed.    ED Treatments / Results  Labs (all labs ordered are listed, but only abnormal results are displayed) Labs Reviewed  RAPID  STREP SCREEN (NOT AT [facility])  CULTURE, GROUP A STREP [facility])  CBC WITH DIFFERENTIAL/PLATELET  TROPONIN I  COMPREHENSIVE METABOLIC PANEL    EKG  EKG Interpretation  Date/Time:  Sunday February 02 2017 00:36:28 EDT Ventricular Rate:  78 PR Interval:    QRS Duration: 93 QT Interval:  352 QTC Calculation: 401 R Axis:   90 Text Interpretation:  Sinus rhythm Borderline right axis deviation No prior for comparison Confirmed by Ross Marcus ([id number]) on 02/02/2017 1:19:27 AM       Radiology Dg Chest 2 View  Result Date: 02/02/2017 CLINICAL DATA:  Cough and chest pain. EXAM: CHEST  2 VIEW COMPARISON:  Chest CT 02/28/2015 FINDINGS: The cardiomediastinal contours are normal. The lungs are clear. Pulmonary vasculature is normal. No consolidation, pleural effusion, or pneumothorax. EKG lead projects over the left upper chest. No acute osseous abnormalities are seen. IMPRESSION: No active cardiopulmonary disease. Electronically Signed   By: Rubye Oaks M.D.   On: 02/02/2017 01:04    Procedures Procedures (including critical care time)  Medications Ordered in ED Medications  ibuprofen (ADVIL,MOTRIN) tablet 800 mg (800 mg Oral Given 02/02/17 0134)     Initial Impression / Assessment and Plan / ED Course  I have reviewed the triage vital signs and the nursing notes.  Pertinent labs & imaging results that were available during my care of the patient were reviewed by me and considered in my medical decision making (see chart for details).     Patient presents with upper respiratory symptoms and chest pain. Nontoxic on exam. Afebrile. Vital signs reassuring. He does have palatal petechiae. Reproducible chest pain on exam. Doubt ACS or PE. EKG, troponin, chest x-ray clear. No pneumonia. Patient given Toradol for pain. Strep screen is negative. No indication at this time for antibodies. Supportive measures recommended.  After history, exam, and medical workup I feel the patient has been  appropriately medically screened and is safe for discharge home. Pertinent diagnoses were discussed with the patient. Patient was given return precautions.   Final Clinical Impressions(s) / ED Diagnoses   Final diagnoses:  URI with cough and congestion  Chest wall pain    New Prescriptions New Prescriptions   NAPROXEN (NAPROSYN) 500 MG TABLET    Take 1 tablet (500 mg total) by mouth 2 (two) times daily.     Shon Baton, MD 02/02/17 [phone]

## 2017-02-02 NOTE — ED Triage Notes (Signed)
Pt presents with c/o chest pain, cough, sore throat, and sneezing that all started today.

## 2017-02-04 LAB — CULTURE, GROUP A STREP (THRC)

## 2017-07-05 ENCOUNTER — Encounter (HOSPITAL_BASED_OUTPATIENT_CLINIC_OR_DEPARTMENT_OTHER): Payer: Self-pay | Admitting: *Deleted

## 2017-07-05 ENCOUNTER — Emergency Department (HOSPITAL_BASED_OUTPATIENT_CLINIC_OR_DEPARTMENT_OTHER)
Admission: EM | Admit: 2017-07-05 | Discharge: 2017-07-05 | Disposition: A | Discharge status: Home / Self Care | Payer: BLUE CROSS/BLUE SHIELD | Attending: Emergency Medicine | Admitting: Emergency Medicine

## 2017-07-05 ENCOUNTER — Emergency Department (HOSPITAL_BASED_OUTPATIENT_CLINIC_OR_DEPARTMENT_OTHER): Payer: BLUE CROSS/BLUE SHIELD

## 2017-07-05 ENCOUNTER — Other Ambulatory Visit: Payer: Self-pay

## 2017-07-05 DIAGNOSIS — R091 Pleurisy: Secondary | ICD-10-CM | POA: Diagnosis not present

## 2017-07-05 DIAGNOSIS — Z79899 Other long term (current) drug therapy: Secondary | ICD-10-CM | POA: Insufficient documentation

## 2017-07-05 DIAGNOSIS — R0789 Other chest pain: Secondary | ICD-10-CM

## 2017-07-05 DIAGNOSIS — R079 Chest pain, unspecified: Secondary | ICD-10-CM | POA: Diagnosis present

## 2017-07-05 MED ORDER — IBUPROFEN 800 MG PO TABS: 800.0000 mg | ORAL_TABLET | Freq: Three times a day (TID) | ORAL | 0 refills | Status: AC

## 2017-07-05 NOTE — ED Triage Notes (Signed)
[facility] cp onset 5-6 minutes ago pain increased w movement denies inj  Was seen 2 months ago for same

## 2017-07-05 NOTE — ED Provider Notes (Signed)
[facility] EMERGENCY DEPARTMENT Provider Note   CSN: [id number] Arrival date & time: 07/05/17  0102     History   Chief Complaint Chief Complaint  Patient presents with  . Chest Pain    HPI Dustin Bell is a 32 y.o. male.  Chief complaint is chest pain  HPI presents with a few hour history of sharp well localized left-sided anterior chest pain.  Similar to an episode he had several months ago.  Had a normal ER evaluation at that time.  Sharp.  Somewhat pleuritic.  Does not her to use or move his arm.  No cough.  No recent illness.  No fever.  No exertional pain or symptoms.  Past Medical History:  Diagnosis Date  . Migraine   . Penile curvature, acquired    INJURY    There are no active problems to display for this patient.   Past Surgical History:  Procedure Laterality Date  . NESBIT PROCEDURE N/A 06/03/2016   Procedure: NESBIT PROCEDURE 16 DOT PLICATION;  Surgeon: Ihor GullyMark Ottelin, MD;  [facility];  Service: Urology;  Laterality: N/A;  . NO PAST SURGERIES         Home Medications    Prior to Admission medications   Medication Sig Start Date End Date Taking? Authorizing Provider  atorvastatin (LIPITOR) 40 MG tablet Take 40 mg by mouth daily.   Yes [provider]  FLUoxetine (PROZAC) 40 MG capsule Take 40 mg by mouth every evening.    [provider]  ibuprofen (ADVIL,MOTRIN) 800 MG tablet Take 1 tablet (800 mg total) by mouth 3 (three) times daily. 07/05/17   Rolland PorterJames, Hilja Kintzel, MD  naproxen (NAPROSYN) 500 MG tablet Take 1 tablet (500 mg total) by mouth 2 (two) times daily. 02/02/17   Horton, Mayer Maskerourtney F, MD  Oxycodone HCl 10 MG TABS Take 1 tablet (10 mg total) by mouth every 4 (four) hours as needed. 06/03/16   Ihor Gullyttelin, Andren Bethea, MD    Family History No family history on file.  Social History Social History   Tobacco Use  . Smoking status: Never Smoker  . Smokeless tobacco: Former NeurosurgeonUser    Types: Snuff  Substance  Use Topics  . Alcohol use: No  . Drug use: No     Allergies   Patient has no known allergies.   Review of Systems Review of Systems  Constitutional: Negative for appetite change, chills, diaphoresis, fatigue and fever.  HENT: Negative for mouth sores, sore throat and trouble swallowing.   Eyes: Negative for visual disturbance.  Respiratory: Negative for cough, chest tightness, shortness of breath and wheezing.   Cardiovascular: Positive for chest pain.  Gastrointestinal: Negative for abdominal distention, abdominal pain, diarrhea, nausea and vomiting.  Endocrine: Negative for polydipsia, polyphagia and polyuria.  Genitourinary: Negative for dysuria, frequency and hematuria.  Musculoskeletal: Negative for gait problem.  Skin: Negative for color change, pallor and rash.  Neurological: Negative for dizziness, syncope, light-headedness and headaches.  Hematological: Does not bruise/bleed easily.  Psychiatric/Behavioral: Negative for behavioral problems and confusion.     Physical Exam Updated Vital Signs BP (!) 152/96 (BP Location: Left Arm)   Pulse 90   Temp 98.2 F (36.8 C) (Oral)   Resp 18   Ht 5\' 7"  (1.702 m)   Wt 70.3 kg (155 lb)   SpO2 100%   BMI 24.28 kg/m   Physical Exam  Constitutional: He appears well-developed and well-nourished.  HENT:  Head: Normocephalic and atraumatic.  Eyes: Conjunctivae are  normal.  Neck: Neck supple.  Cardiovascular: Normal rate and regular rhythm.  No murmur heard. Tenderness in the left anterior chest.  Clear bilateral breath sounds.  Pulmonary/Chest: Effort normal and breath sounds normal. No respiratory distress.  Abdominal: Soft. There is no tenderness.  Musculoskeletal: He exhibits no edema.  Neurological: He is alert.  Skin: Skin is warm and dry.  Psychiatric: He has a normal mood and affect.  Nursing note and vitals reviewed.    ED Treatments / Results  Labs (all labs ordered are listed, but only abnormal results are  displayed) Labs Reviewed - No data to display  EKG  EKG Interpretation  Date/Time:  Saturday July 05 2017 01:12:51 EST Ventricular Rate:  91 PR Interval:  140 QRS Duration: 82 QT Interval:  334 QTC Calculation: 410 R Axis:   70 Text Interpretation:  Normal sinus rhythm Normal ECG Confirmed by Rolland Porter ([id number]) on 07/05/2017 4:06:38 AM       Radiology No results found.  Procedures Procedures (including critical care time)  Medications Ordered in ED Medications - No data to display   Initial Impression / Assessment and Plan / ED Course  I have reviewed the triage vital signs and the nursing notes.  Pertinent labs & imaging results that were available during my care of the patient were reviewed by me and considered in my medical decision making (see chart for details).    Benign exam.  Low risk history.  Normal EKG and chest x-ray.  No pneumothorax, infiltrate, effusion.  No sign of pericarditis.  Sinus rhythm.  Plan Advil/ibuprofen.  Recheck as needed.  Final Clinical Impressions(s) / ED Diagnoses   Final diagnoses:  Chest wall pain  Pleurisy    ED Discharge Orders        Ordered    ibuprofen (ADVIL,MOTRIN) 800 MG tablet  3 times daily     07/05/17 0455       Rolland Porter, MD 07/05/17 0500

## 2018-04-25 IMAGING — DX DG CHEST 2V
2 series · 2 of 2 positions shown · non-contrast
Comparison: 02/02/2017

CLINICAL DATA: Chest pain, onset today.

EXAM:
CHEST  2 VIEW

[chest pa]
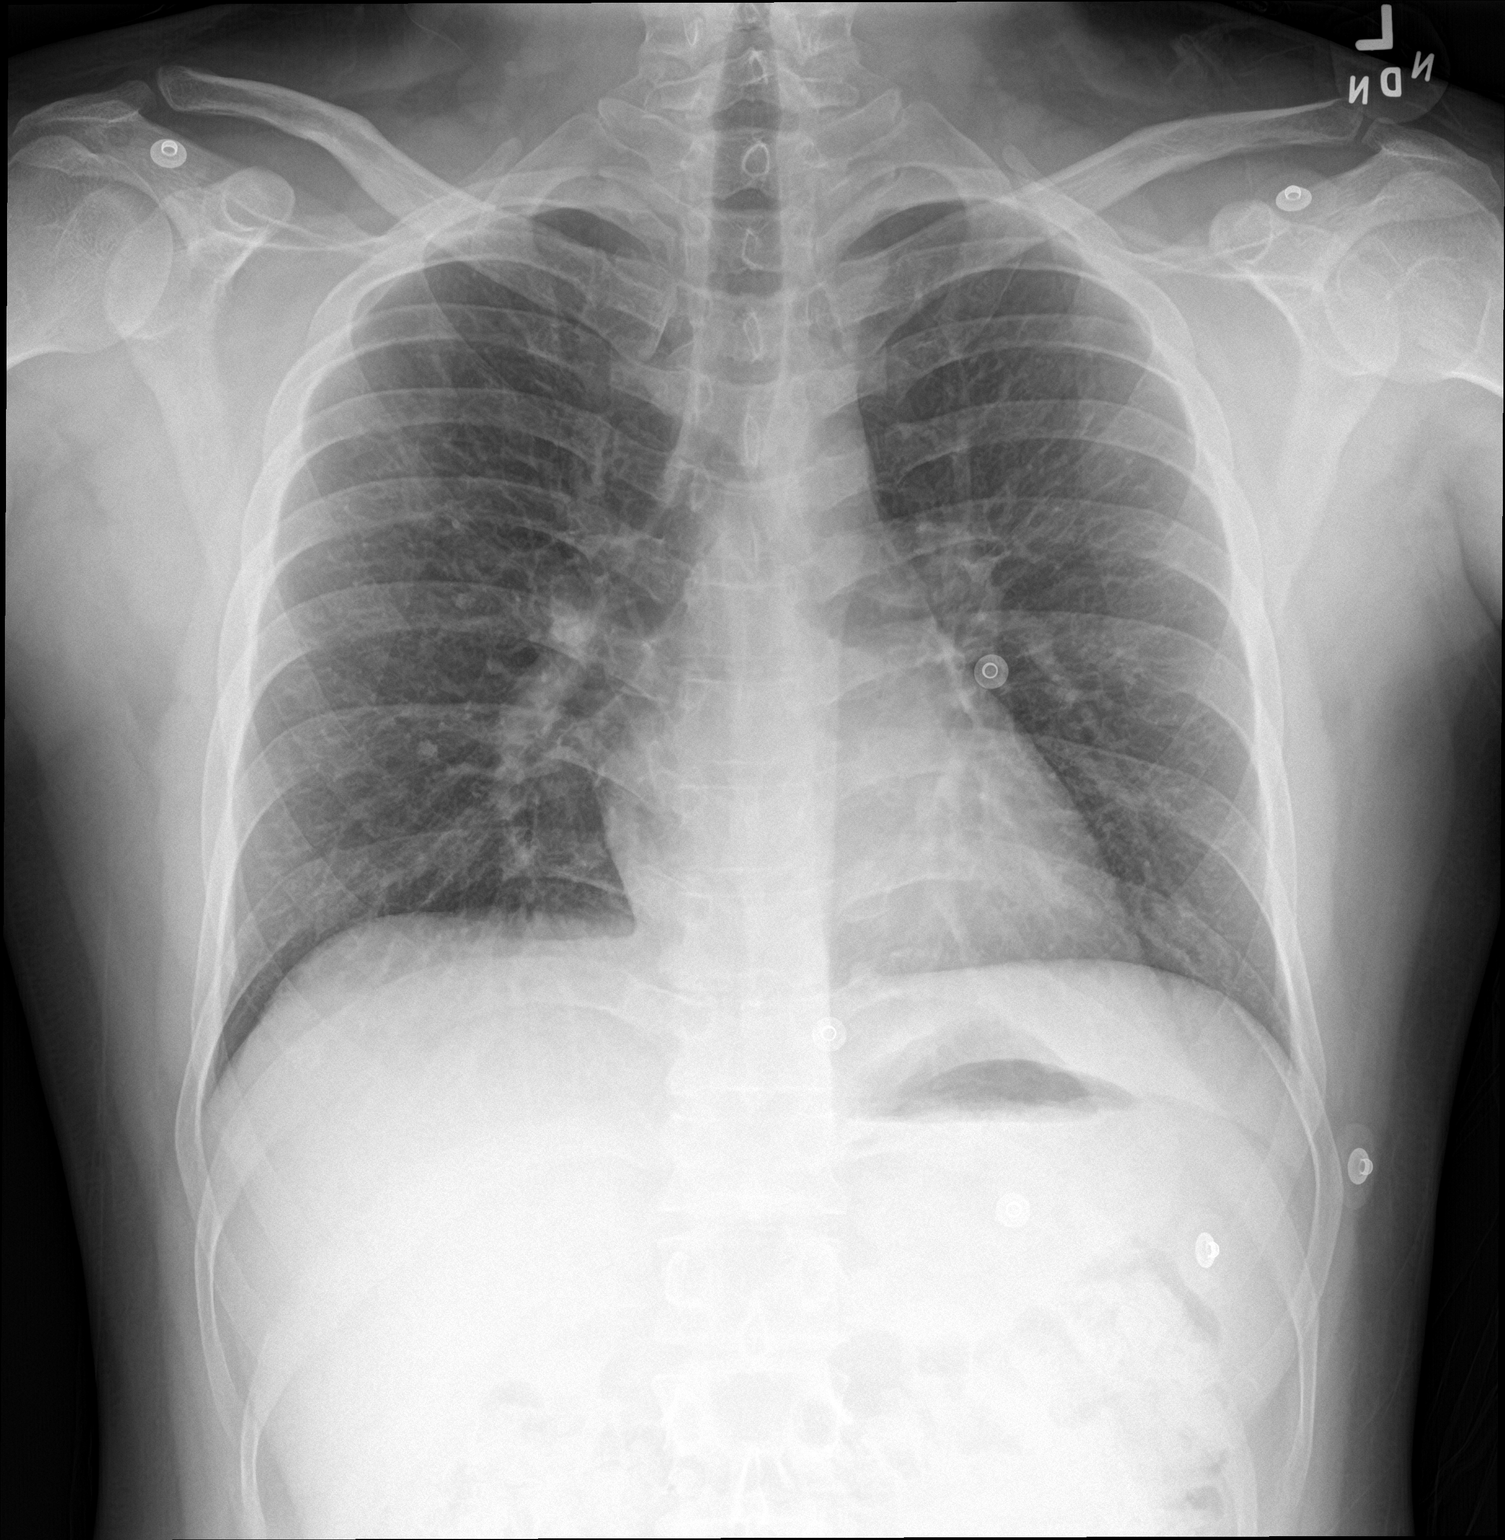

[chest lat]
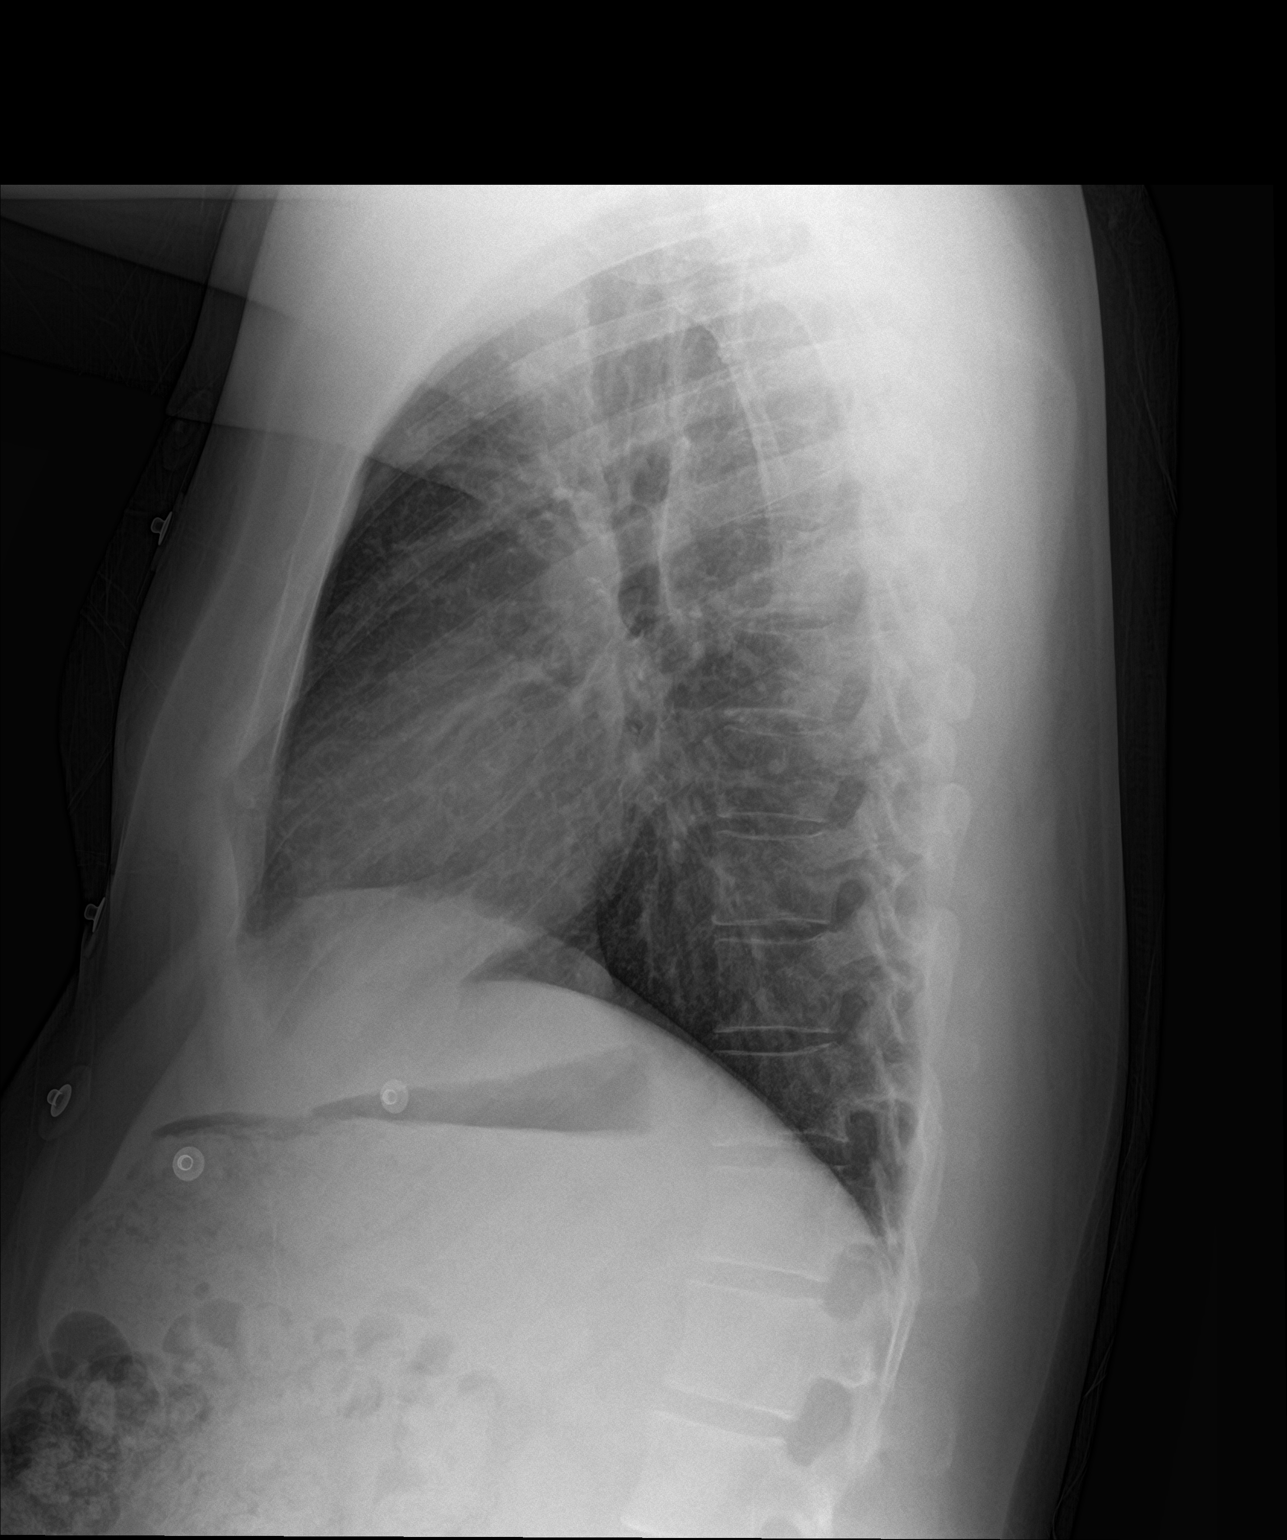

[2 of 2 positions shown; findings below may reference images not displayed]

FINDINGS: The cardiomediastinal contours are normal. The lungs are clear.
Pulmonary vasculature is normal. No consolidation, pleural effusion,
or pneumothorax. No acute osseous abnormalities are seen.
IMPRESSION: No active cardiopulmonary disease.

## 2018-10-15 ENCOUNTER — Other Ambulatory Visit: Payer: Self-pay

## 2018-10-15 ENCOUNTER — Emergency Department (HOSPITAL_BASED_OUTPATIENT_CLINIC_OR_DEPARTMENT_OTHER)
Admission: EM | Admit: 2018-10-15 | Discharge: 2018-10-15 | Disposition: A | Discharge status: Home / Self Care | Payer: BLUE CROSS/BLUE SHIELD | Attending: Emergency Medicine | Admitting: Emergency Medicine

## 2018-10-15 ENCOUNTER — Encounter (HOSPITAL_BASED_OUTPATIENT_CLINIC_OR_DEPARTMENT_OTHER): Payer: Self-pay | Admitting: *Deleted

## 2018-10-15 DIAGNOSIS — Y92410 Unspecified street and highway as the place of occurrence of the external cause: Secondary | ICD-10-CM | POA: Diagnosis not present

## 2018-10-15 DIAGNOSIS — Y9389 Activity, other specified: Secondary | ICD-10-CM | POA: Insufficient documentation

## 2018-10-15 DIAGNOSIS — Z79899 Other long term (current) drug therapy: Secondary | ICD-10-CM | POA: Insufficient documentation

## 2018-10-15 DIAGNOSIS — Y999 Unspecified external cause status: Secondary | ICD-10-CM | POA: Insufficient documentation

## 2018-10-15 DIAGNOSIS — M545 Low back pain: Secondary | ICD-10-CM | POA: Insufficient documentation

## 2018-10-15 DIAGNOSIS — M542 Cervicalgia: Secondary | ICD-10-CM | POA: Diagnosis present

## 2018-10-15 MED ORDER — NAPROXEN 375 MG PO TABS: 375.0000 mg | ORAL_TABLET | Freq: Two times a day (BID) | ORAL | 0 refills | Status: AC

## 2018-10-15 MED ORDER — METHOCARBAMOL 500 MG PO TABS: 500.0000 mg | ORAL_TABLET | Freq: Two times a day (BID) | ORAL | 0 refills | Status: AC
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
## 2023-07-30 ENCOUNTER — Ambulatory Visit: Payer: No Typology Code available for payment source | Admitting: Cardiology

## 2023-10-07 ENCOUNTER — Ambulatory Visit: Payer: Self-pay | Attending: Cardiology | Admitting: Cardiology

## 2023-10-07 NOTE — Progress Notes (Deleted)
  Cardiology Office Note:  .   Date:  10/07/2023  ID:  Dustin Bell, DOB Mar 29, 1986, MRN [id number] PCP: Jearldine Mina, MD  [facility] Providers Cardiologist:  None { Click to update primary MD,subspecialty MD or APP then REFRESH:1}  History of Present Illness: Dustin Bell   Dustin Bell is a 38 y.o. Patient with mixed hypercholesterolemia, premature coronary disease in his father with markedly elevated Lp(a) at 55, presents to establish care, he has had cardiac workup including cardiac catheterization in January 2025 which revealed normal coronary arteries.  Discussed the use of AI scribe software for clinical note transcription with the patient, who gave verbal consent to proceed.  History of Present Illness    Labs   External Labs:  Care everywhere labs 09/30/2023:  Sodium 138, potassium 3.9, BUN 15, creatinine 0.87, EGFR >90 mL.  Total cholesterol 210, triglycerides 476, HDL 34, LDL 117.  Non-HDL cholesterol 176.  Labs 05/30/2023:  Vitamin D 24.6.  ROS  ***ROS  Physical Exam:   VS:  There were no vitals taken for this visit.   Wt Readings from Last 3 Encounters:  10/15/18 158 lb (71.7 kg)  07/05/17 155 lb (70.3 kg)  02/02/17 155 lb (70.3 kg)    ***Physical Exam Studies Reviewed: Dustin Bell    [facility] Cardiac catheterization 06/13/2023: Angiographically normal coronary arteries, right dominant circulation.  EKG:         ***  Medications and allergies    No Known Allergies   Current Outpatient Medications:    atorvastatin (LIPITOR) 40 MG tablet, Take 40 mg by mouth daily., Disp: , Rfl:    FLUoxetine (PROZAC) 40 MG capsule, Take 40 mg by mouth every evening., Disp: , Rfl:    ibuprofen  (ADVIL ,MOTRIN ) 800 MG tablet, Take 1 tablet (800 mg total) by mouth 3 (three) times daily., Disp: 21 tablet, Rfl: 0   methocarbamol  (ROBAXIN ) 500 MG tablet, Take 1 tablet (500 mg total) by mouth 2 (two) times daily., Disp: 20 tablet, Rfl: 0   naproxen  (NAPROSYN ) 375 MG  tablet, Take 1 tablet (375 mg total) by mouth 2 (two) times daily., Disp: 20 tablet, Rfl: 0   OMEPRAZOLE PO, Take by mouth., Disp: , Rfl:    Oxycodone  HCl 10 MG TABS, Take 1 tablet (10 mg total) by mouth every 4 (four) hours as needed., Disp: 28 tablet, Rfl: 0   No orders of the defined types were placed in this encounter.    There are no discontinued medications.   ASSESSMENT AND PLAN: .      ICD-10-CM   1. Mixed hyperlipidemia  E78.2       Assessment and Plan Assessment & Plan      Signed,  Knox Perl, MD, [facility] 10/07/2023, 1:33 PM [facility] [address] [place], [place] [address] Phone: [phone]. Fax:  [phone]

## 2023-10-08 ENCOUNTER — Encounter: Payer: Self-pay | Admitting: Cardiology

## 2024-03-23 NOTE — Progress Notes (Signed)
 Payor: [employer] MEDICAID [employer] / Plan: [place] MEDICAID [employer] COMMUNITY PLAN / Product Type: Managed Medicaid /   History of Present Illness   History of Present Illness The patient presents for evaluation of hematuria and right lower back pain.  Presents to clinic today complaining of a 1 week history of right lower back pain that radiates towards the side with associated hematuria.  Denies fever, chills, nausea/vomiting, abdominal pain, dysuria, urinary frequency, urgency, penile discharge, known trauma/injury to explain current symptoms.  Denies history of kidney stones.  Was initially seen in the urgent care on 10/30.  Was found to have a large amount of blood in his urine.  Was suspected to have muscle strain versus kidney stone.  Was discharged home with Flomax, 7.5 meloxicam, and Flexeril which provided minimal improvement despite symptomatic relief, but he noticed that the blood in his urine decreased while taking the Flomax, but returned after discontinuing it.  States that he will experience a sharp stabbing pain for 5 to 15 minutes and then spontaneously resolve.  Denies any known aggravating/alleviating symptoms.  Denies experiencing similar symptoms in the past.   Review of Systems  Constitutional:  Negative for chills, diaphoresis, fatigue and fever.  Gastrointestinal:  Negative for abdominal distention, abdominal pain, nausea and vomiting.  Genitourinary:  Positive for hematuria. Negative for decreased urine volume, dysuria, frequency, penile discharge, penile pain, penile swelling, scrotal swelling, testicular pain and urgency.  Musculoskeletal:        Right lower back pain  All other systems reviewed and are negative.   Past Contributory History   Current Medications[1] Allergies[2] Medical History[3] Surgical History[4] Family History[5]    Physical Exam   Vitals:   03/23/24 1544  BP: 106/68  Pulse: 88  Resp: 18  Temp: 97.6 F (36.4 C)  SpO2: 97%  Weight: 76.7 kg  (169 lb)  Height: 1.702 m (5' 7)   Physical Exam Vitals and nursing note reviewed.  Constitutional:      General: He is not in acute distress.    Appearance: Normal appearance. He is normal weight. He is not ill-appearing, toxic-appearing or diaphoretic.  HENT:     Head: Normocephalic and atraumatic.     Right Ear: External ear normal.     Left Ear: External ear normal.     Nose: Nose normal.     Mouth/Throat:     Lips: Pink.     Mouth: Mucous membranes are moist.  Eyes:     Conjunctiva/sclera: Conjunctivae normal.     Pupils: Pupils are equal, round, and reactive to light.  Cardiovascular:     Rate and Rhythm: Normal rate and regular rhythm.  Pulmonary:     Effort: Pulmonary effort is normal. No respiratory distress.     Breath sounds: Normal breath sounds. No stridor. No wheezing, rhonchi or rales.  Chest:     Chest wall: No tenderness.  Musculoskeletal:        General: Normal range of motion.     Cervical back: Normal range of motion and neck supple.       Back:  Skin:    General: Skin is warm.  Neurological:     General: No focal deficit present.     Mental Status: He is alert and oriented to person, place, and time.  Psychiatric:        Mood and Affect: Mood normal.        Behavior: Behavior is cooperative.     Procedures   Procedures  Results  Lab results: Recent Results (from the past 24 hours)  POC Urinalysis Auto without Microscopic   Collection Time: 03/23/24  3:57 PM  Result Value Ref Range   Color, Urine Yellow Yellow   Clarity, Urine Clear Clear   Glucose, Urine Negative Negative mg/dL   Bilirubin, Urine Negative Negative   Ketones, Urine Negative Negative mg/dL   Specific Gravity, Urine 1.025 1.010, 1.015, 1.020, 1.025   Blood, Urine Moderate (A) Negative   pH, Urine 5.5 5.0, 5.5, 6.0, 6.5, 7.0, 7.5, 8.0   Protein, Urine Negative Negative mg/dL   Urobilinogen, Urine 0.2 <2.0 mg/dL   Nitrite, Urine Negative Negative   Leukocyte Esterase,  Urine Negative Negative   Kit/Device Lot # [id number]    Kit/Device Expiration Date 9/26   Basic Metabolic Panel   Collection Time: 03/23/24  4:37 PM  Result Value Ref Range   Sodium 138 136 - 145 mmol/L   Potassium 3.8 3.4 - 4.5 mmol/L   Chloride 100 98 - 107 mmol/L   CO2 30 21 - 31 mmol/L   Anion Gap 8 6 - 14 mmol/L   Glucose, Random 83 70 - 99 mg/dL   Blood Urea Nitrogen (BUN) 17 7 - 25 mg/dL   Creatinine 9.10 9.29 - 1.30 mg/dL   eGFR >09 >40 fO/fpw/8.26f7   Calcium 9.6 8.6 - 10.3 mg/dL   BUN/Creatinine Ratio    CBC with Differential   Collection Time: 03/23/24  4:37 PM  Result Value Ref Range   WBC 8.30 4.40 - 11.00 10*3/uL   RBC 5.47 4.50 - 5.90 10*6/uL   Hemoglobin 15.4 14.0 - 17.5 g/dL   Hematocrit 55.1 58.4 - 50.4 %   Mean Corpuscular Volume (MCV) 82.0 80.0 - 96.0 fL   Mean Corpuscular Hemoglobin (MCH) 28.2 27.5 - 33.2 pg   Mean Corpuscular Hemoglobin Conc (MCHC) 34.5 33.0 - 37.0 g/dL   Red Cell Distribution Width (RDW) 12.9 12.3 - 17.0 %   Platelet Count (PLT) 306 150 - 450 10*3/uL   Mean Platelet Volume (MPV) 8.1 6.8 - 10.2 fL   Neutrophils % 47 %   Lymphocytes % 42 %   Monocytes % 7 %   Eosinophils % 4 %   Basophils % 1 %   nRBC % 0 %   Neutrophils Absolute 3.90 1.80 - 7.80 10*3/uL   Lymphocytes # 3.40 1.00 - 4.80 10*3/uL   Monocytes # 0.60 0.00 - 0.80 10*3/uL   Eosinophils # 0.30 0.00 - 0.50 10*3/uL   Basophils # 0.10 0.00 - 0.20 10*3/uL   nRBC Absolute 0.00 <=0.00 10*3/uL  Chlamydia / Gonococcus (GC), NAAT   Collection Time: 03/23/24  4:59 PM  Result Value Ref Range   Chlamydia (CT) Negative Negative   Gonorrhea (GC) Negative Negative    X-ray results: Radiology Results (last 7 days)     ** No results found for the last 168 hours. **       Diagnosis & Disposition   1. Hematuria, unspecified type  POC Urinalysis Auto without Microscopic   Urine Culture   Urine Culture   Chlamydia / Gonococcus (GC), NAAT   CBC with Differential   Basic Metabolic  Panel   CT Stone Search WO Contrast    2. Acute right flank pain  CT Stone Search WO Contrast      Assessment & Plan 1.  Hematuria/right flank pain: - Blood in the urine has been present for about a week, initially improving with medication but returning after cessation. - Pain is  not reproducible in clinic with direct palpation, trunk rotation, or CVA tenderness.  He has moderate amount of blood in urine today while in clinic.  He has no known history of nephrolithiasis.  Will obtain CBC, BMP, urine culture, and testing for gonorrhea/chlamydia.  Unfortunately, were not able to obtain CT imaging today, but will pursue CT abdomen pelvis without contrast for further workup and evaluation tomorrow.  Discussed at length worrisome signs and symptoms and when to call/return to clinic/report emergency department.  For now, encouraged continued supportive care with Flomax and 600-800 mg of ibuprofen  as needed for pain.  Plan pending lab results/CT.    Counseled regarding condition(s) and all patient questions answered. If a new prescription was given today, then I discussed potential side effects, drug interactions, and instructions for taking the medication. Reviewed worrisome signs and symptoms to watch for and instructed patient to seek medical attention for any worsening, prolonged, changing, or new symptoms. I have discussed the signs and symptoms that would warrant proceeding to an emergency department.  All questions have been answered and the patient has voiced understanding and agreement with the plan of care.   Return for Follow-up with CT tomorrow.  There are no Patient Instructions on file for this visit.   This documented history was created with the aid of Dragon dictation software as well as [employer], an automated transcribing artificial intelligence program, with light post-production editing.       [1] Current Outpatient Medications  Medication Sig Dispense Refill  .  atorvastatin (LIPITOR) 40 mg tablet Take 1 tablet (40 mg total) by mouth daily. 30 tablet 0  . cetirizine (ZyrTEC) 10 mg tablet Take 1 tablet (10 mg total) by mouth daily. 30 tablet 0  . cyclobenzaprine (FLEXERIL) 10 mg tablet Take 1 tablet (10 mg total) by mouth 2 (two) times a day as needed for muscle spasms. 14 tablet 0  . ergocalciferol (VITAMIN D2) 1,250 mcg (50,000 unit) capsule Take 1 capsule (50,000 Units total) by mouth every 7 days. 4 capsule 0  . FLUoxetine (PROzac) 20 mg capsule Take 1 capsule (20 mg total) by mouth daily Indications: psychiatric disorder. 90 capsule 1  . fluticasone propionate (FLONASE) 50 mcg/spray nasal spray Administer 1 spray into each nostril daily. 16 g 0  . meloxicam (MOBIC) 7.5 mg tablet Take 1 tablet (7.5 mg total) by mouth daily. 90 tablet 3  . omega 3-dha-epa-fish oil (OMEGA 3) 1,000 mg DR capsule Take 1 capsule by mouth daily.    SABRA omeprazole (PriLOSEC) 40 mg DR capsule Take 1 capsule (40 mg total) by mouth in the morning. 90 capsule 0  . tamsulosin (FLOMAX) 0.4 mg cap Take 1 capsule (0.4 mg total) by mouth daily for 14 days. 14 capsule 0  . traZODone (DESYREL) 50 mg tablet Take 1 tablet (50 mg total) by mouth nightly as needed for sleep Indications: insomnia associated with depression. 30 tablet 1   No current facility-administered medications for this visit.  [2] No Known Allergies [3] Past Medical History: Diagnosis Date  . Anxiety   . Dyslipidemia   . Headache   . TMJ syndrome   [4] Past Surgical History: Procedure Laterality Date  . CARDIAC CATHETERIZATION Left 06/13/2023   CV CATHETERIZATION LEFT HEART performed by Carlin Hoe, MD at [facility] INVASIVE LAB  . CARDIAC CATHETERIZATION N/A 06/13/2023   Coronary angiography performed by Carlin Hoe, MD at [facility] INVASIVE LAB  . NO PAST SURGERIES     Procedure: NO PAST SURGERIES  [  5] Family History Problem Relation Name Age of Onset  . Hyperlipidemia Mother Cathern Flight   . Headaches Mother  Cathern Flight   . Depression Mother Cathern Flight   . Heart disease Father    . Stroke Neg Hx    . Seizures Neg Hx    . Parkinsonism Neg Hx    . Multiple sclerosis Neg Hx    . Dementia Neg Hx

## 2024-03-29 NOTE — Progress Notes (Signed)
 Labs overall stable. The only recommendation is continuing the statin.

## 2024-04-09 ENCOUNTER — Emergency Department (HOSPITAL_COMMUNITY)
Admission: EM | Admit: 2024-04-09 | Discharge: 2024-04-09 | Disposition: A | Discharge status: Home / Self Care | Attending: Emergency Medicine | Admitting: Emergency Medicine

## 2024-04-09 ENCOUNTER — Emergency Department (HOSPITAL_COMMUNITY)

## 2024-04-09 ENCOUNTER — Other Ambulatory Visit: Payer: Self-pay

## 2024-04-09 ENCOUNTER — Encounter (HOSPITAL_COMMUNITY): Payer: Self-pay

## 2024-04-09 DIAGNOSIS — R339 Retention of urine, unspecified: Secondary | ICD-10-CM | POA: Diagnosis present

## 2024-04-09 DIAGNOSIS — N132 Hydronephrosis with renal and ureteral calculous obstruction: Secondary | ICD-10-CM | POA: Insufficient documentation

## 2024-04-09 DIAGNOSIS — N201 Calculus of ureter: Secondary | ICD-10-CM

## 2024-04-09 LAB — CBC WITH DIFFERENTIAL/PLATELET
Abs Immature Granulocytes: 0.03 K/uL (ref 0.00–0.07)
Basophils Absolute: 0.1 K/uL (ref 0.0–0.1)
Basophils Relative: 1 %
Eosinophils Absolute: 0.4 K/uL (ref 0.0–0.5)
Eosinophils Relative: 4 %
HCT: 41 % (ref 39.0–52.0)
Hemoglobin: 14.4 g/dL (ref 13.0–17.0)
Immature Granulocytes: 0 %
Lymphocytes Relative: 36 %
Lymphs Abs: 3.1 K/uL (ref 0.7–4.0)
MCH: 28.2 pg (ref 26.0–34.0)
MCHC: 35.1 g/dL (ref 30.0–36.0)
MCV: 80.4 fL (ref 80.0–100.0)
Monocytes Absolute: 0.6 K/uL (ref 0.1–1.0)
Monocytes Relative: 7 %
Neutro Abs: 4.4 K/uL (ref 1.7–7.7)
Neutrophils Relative %: 52 %
Platelets: 357 K/uL (ref 150–400)
RBC: 5.1 MIL/uL (ref 4.22–5.81)
RDW: 11.7 % (ref 11.5–15.5)
WBC: 8.5 K/uL (ref 4.0–10.5)
nRBC: 0 % (ref 0.0–0.2)

## 2024-04-09 LAB — URINALYSIS, ROUTINE W REFLEX MICROSCOPIC
Bilirubin Urine: NEGATIVE
Glucose, UA: NEGATIVE mg/dL
Ketones, ur: NEGATIVE mg/dL
Leukocytes,Ua: NEGATIVE
Nitrite: NEGATIVE
Protein, ur: 100 mg/dL — AB
Specific Gravity, Urine: 1.023 (ref 1.005–1.030)
pH: 5 (ref 5.0–8.0)

## 2024-04-09 LAB — BASIC METABOLIC PANEL WITH GFR
Anion gap: 6 (ref 5–15)
BUN: 12 mg/dL (ref 6–20)
CO2: 28 mmol/L (ref 22–32)
Calcium: 9 mg/dL (ref 8.9–10.3)
Chloride: 103 mmol/L (ref 98–111)
Creatinine, Ser: 1.31 mg/dL — ABNORMAL HIGH (ref 0.61–1.24)
GFR, Estimated: >60 mL/min (ref >60)
Glucose, Bld: 117 mg/dL — ABNORMAL HIGH (ref 70–99)
Potassium: 3.6 mmol/L (ref 3.5–5.1)
Sodium: 137 mmol/L (ref 135–145)

## 2024-04-09 MED ORDER — ONDANSETRON 4 MG PO TBDP
4.0000 mg | ORAL_TABLET | Freq: Three times a day (TID) | ORAL | 0 refills | Status: AC | PRN
Start: 2024-04-09 — End: ?

## 2024-04-09 MED ORDER — OXYCODONE-ACETAMINOPHEN 5-325 MG PO TABS
1.0000 | ORAL_TABLET | ORAL | 0 refills | Status: AC | PRN
Start: 2024-04-09 — End: ?

## 2024-04-09 NOTE — ED Triage Notes (Signed)
 Pt c/o urinary retention since 1300 today, states that only a small amount is voided. States that he was diagnosed with a kidney stone a few weeks and has been seen by urology.

## 2024-04-09 NOTE — ED Provider Notes (Signed)
 [place] EMERGENCY DEPARTMENT AT [facility] Provider Note   CSN: [id number] Arrival date & time: 04/09/24  2037     Patient presents with: Urinary Retention   Dustin Bell is a 38 y.o. male.   The history is provided by the patient and medical records.   38 y.o. M here with urinary issues.  Patient reports all day today he has been having difficulty with urination, states feels the urge to go and then will have to strain or only pass small amounts of urine.  Seem to get better up this evening.  He does have a known kidney stone on his right side, has been following with urology at [facility] for this.  He was initially scheduled for lithotripsy but then opted for trial of medications so this has been put on hold.  He states now he just feels like there is something stuck in his urethra.  He denies any hematuria.  No penile discharge.  No fever or chills.  He has been compliant with his Flomax.  Prior to Admission medications   Medication Sig Start Date End Date Taking? Authorizing Provider  atorvastatin (LIPITOR) 40 MG tablet Take 40 mg by mouth daily.    [provider]  FLUoxetine (PROZAC) 40 MG capsule Take 40 mg by mouth every evening.    [provider]  ibuprofen  (ADVIL ,MOTRIN ) 800 MG tablet Take 1 tablet (800 mg total) by mouth 3 (three) times daily. 07/05/17   Lynwood Anes, MD  methocarbamol  (ROBAXIN ) 500 MG tablet Take 1 tablet (500 mg total) by mouth 2 (two) times daily. 10/15/18   Claudene Lenis, NP  naproxen  (NAPROSYN ) 375 MG tablet Take 1 tablet (375 mg total) by mouth 2 (two) times daily. 10/15/18   Claudene Lenis, NP  OMEPRAZOLE PO Take by mouth.    [provider]  Oxycodone  HCl 10 MG TABS Take 1 tablet (10 mg total) by mouth every 4 (four) hours as needed. 06/03/16   Ottelin, Mark, MD    Allergies: Patient has no known allergies.    Review of Systems  Genitourinary:  Positive for difficulty urinating.  All other systems reviewed and are  negative.   Updated Vital Signs BP (!) 157/107 (BP Location: Right Arm)   Pulse 89   Temp 97.8 F (36.6 C)   Resp 18   SpO2 98%   Physical Exam Vitals and nursing note reviewed.  Constitutional:      Appearance: He is well-developed.  HENT:     Head: Normocephalic and atraumatic.  Eyes:     Conjunctiva/sclera: Conjunctivae normal.     Pupils: Pupils are equal, round, and reactive to light.  Cardiovascular:     Rate and Rhythm: Normal rate and regular rhythm.     Heart sounds: Normal heart sounds.  Pulmonary:     Effort: Pulmonary effort is normal.     Breath sounds: Normal breath sounds.  Abdominal:     General: Bowel sounds are normal.     Palpations: Abdomen is soft.     Tenderness: There is no abdominal tenderness. There is no right CVA tenderness or left CVA tenderness.  Musculoskeletal:        General: Normal range of motion.     Cervical back: Normal range of motion.  Skin:    General: Skin is warm and dry.  Neurological:     Mental Status: He is alert and oriented to person, place, and time.     (all labs ordered are listed,  but only abnormal results are displayed) Labs Reviewed  BASIC METABOLIC PANEL WITH GFR - Abnormal; Notable for the following components:      Result Value   Glucose, Bld 117 (*)    Creatinine, Ser 1.31 (*)    All other components within normal limits  URINALYSIS, ROUTINE W REFLEX MICROSCOPIC - Abnormal; Notable for the following components:   APPearance HAZY (*)    Hgb urine dipstick MODERATE (*)    Protein, ur 100 (*)    Bacteria, UA RARE (*)    All other components within normal limits  CBC WITH DIFFERENTIAL/PLATELET    EKG: None  Radiology: CT Renal Stone Study Result Date: 04/09/2024 EXAM: CT ABDOMEN AND PELVIS WITHOUT CONTRAST 04/09/2024 09:24:00 PM TECHNIQUE: CT of the abdomen and pelvis was performed without the administration of intravenous contrast. Multiplanar reformatted images are provided for review. Automated  exposure control, iterative reconstruction, and/or weight-based adjustment of the mA/kV was utilized to reduce the radiation dose to as low as reasonably achievable. COMPARISON: None available. CLINICAL HISTORY: Abdominal/ right flank pain FINDINGS: LOWER CHEST: Lung bases are free of acute infiltrate or sizable effusion. Scattered calcified hilar nodes are noted, consistent with prior granulomatous disease. No calcified granulomas are seen. LIVER: The liver is within normal limits. GALLBLADDER AND BILE DUCTS: The gallbladder is within normal limits. No biliary ductal dilatation. SPLEEN: The spleen is unremarkable. PANCREAS: The pancreas is unremarkable. ADRENAL GLANDS: Adrenal glands are within normal limits. KIDNEYS, URETERS AND BLADDER: Kidneys are well visualized bilaterally. No renal calculi are seen. Left ureter is within normal limits. Right ureter is mildly dilated extending inferiorly to the level of the ureterovesical junction, where a 7 mm stone is identified. The bladder is decompressed. GI AND BOWEL: Stomach is unremarkable. Small bowel is unremarkable. No obstructive or inflammatory changes of the colon are seen. Scattered fecal material is noted without obstruction. The appendix is within normal limits. PERITONEUM AND RETROPERITONEUM: No free fluid is seen. No free air. VASCULATURE: The abdominal aorta is within normal limits. LYMPH NODES: No lymphadenopathy. REPRODUCTIVE ORGANS: The prostate is unremarkable. BONES AND SOFT TISSUES: No acute osseous abnormality. IMPRESSION: 1. 7 mm stone at the right ureterovesical junction with associated right hydronephrosis and hydroureter Electronically signed by: Oneil Devonshire MD 04/09/2024 09:52 PM EST RP Workstation: [id number]     Procedures   Medications Ordered in the ED - No data to display                                  Medical Decision Making Amount and/or Complexity of Data Reviewed Labs: ordered. Radiology: ordered and independent  interpretation performed. ECG/medicine tests: ordered and independent interpretation performed.  Risk Prescription drug management.   38 year old male here with urinary retention.  Has been able to pass some urine but decreased from baseline.  Has a known kidney stone on his right side, following with urology at [facility].  Afebrile, nontoxic in appearance.  He is comfortable at present and denies pain.  He has no focal CVA tenderness or peritoneal signs.  Labs reviewed-- renal function is at baseline.  UA with hematuria but no signs of infection.  CT stone study with 7mm stone at right UVJ.  Appears to be same stone he has been following with urology about.    Suspect he is having any symptoms due to stone which is at the UVJ.  He is already on Flomax, will initiate pain  and nausea medication.  Encouraged follow-up with urology closely.  Can return here for new concerns.  Final diagnoses:  Right ureteral stone    ED Discharge Orders          Ordered    oxyCODONE -acetaminophen  (PERCOCET) 5-325 MG tablet  Every 4 hours PRN        04/09/24 2304    ondansetron  (ZOFRAN -ODT) 4 MG disintegrating tablet  Every 8 hours PRN        04/09/24 2304               Jarold Olam HERO, PA-C 04/09/24 2307    Patt Alm Macho, MD 04/09/24 2342

## 2024-04-09 NOTE — ED Provider Triage Note (Signed)
 Emergency Medicine Provider Triage Evaluation Note  Dustin Bell , a 38 y.o. male  was evaluated in triage.  Pt complains of urinary retention since 1300 today.  Only voiding a small amount.  Feels like there is something stuck in his urethra.  Review of Systems  Positive: Urinary retention, dysuria, urgency, frequency, right flank pain Negative: Fever, chills, nausea, vomiting, hematuria  Physical Exam  BP (!) 157/107 (BP Location: Right Arm)   Pulse 89   Temp 97.8 F (36.6 C)   Resp 18   SpO2 98%  Gen:   Awake, no distress   Resp:  Normal effort  MSK:   Moves extremities without difficulty  Other:    Medical Decision Making  Medically screening exam initiated at 8:45 PM.  Appropriate orders placed.  Dustin Bell was informed that the remainder of the evaluation will be completed by another provider, this initial triage assessment does not replace that evaluation, and the importance of remaining in the ED until their evaluation is complete.  Labs and imaging ordered   Dustin Bell 04/09/24 2048

## 2024-04-09 NOTE — Discharge Instructions (Signed)
 As we discussed, stone is still present on your right side.  Monitor over the weekend, if still hasn't passed, please make sure to follow-up with urology. Take the prescribed medication as directed for pain and nausea as needed.  Continue your flomax. Return to the ED for new or worsening symptoms.
